# Patient Record
Sex: Female | Born: 1985 | Race: Black or African American | Hispanic: No | Marital: Single | Smoking: Former smoker
Health system: Southern US, Community
[De-identification: ages and names within clinical notes are randomized; demographics above are authoritative.]

## PROBLEM LIST (undated history)

## (undated) DIAGNOSIS — A609 Anogenital herpesviral infection, unspecified: Secondary | ICD-10-CM

## (undated) DIAGNOSIS — R87619 Unspecified abnormal cytological findings in specimens from cervix uteri: Secondary | ICD-10-CM

## (undated) DIAGNOSIS — A599 Trichomoniasis, unspecified: Secondary | ICD-10-CM

## (undated) DIAGNOSIS — IMO0002 Reserved for concepts with insufficient information to code with codable children: Secondary | ICD-10-CM

## (undated) DIAGNOSIS — O0930 Supervision of pregnancy with insufficient antenatal care, unspecified trimester: Secondary | ICD-10-CM

## (undated) HISTORY — DX: Trichomoniasis, unspecified: A59.9

## (undated) HISTORY — DX: Unspecified abnormal cytological findings in specimens from cervix uteri: R87.619

## (undated) HISTORY — DX: Reserved for concepts with insufficient information to code with codable children: IMO0002

## (undated) HISTORY — DX: Anogenital herpesviral infection, unspecified: A60.9

## (undated) HISTORY — PX: LEEP: SHX91

## (undated) HISTORY — DX: Supervision of pregnancy with insufficient antenatal care, unspecified trimester: O09.30

## (undated) HISTORY — PX: OTHER SURGICAL HISTORY: SHX169

---
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
## 2006-09-01 ENCOUNTER — Emergency Department (HOSPITAL_COMMUNITY): Admission: EM | Admit: 2006-09-01 | Discharge: 2006-09-01 | Payer: Self-pay | Admitting: Family Medicine

## 2008-04-04 ENCOUNTER — Emergency Department (HOSPITAL_COMMUNITY): Admission: EM | Admit: 2008-04-04 | Discharge: 2008-04-04 | Payer: Self-pay | Admitting: Family Medicine

## 2010-03-04 ENCOUNTER — Ambulatory Visit (HOSPITAL_COMMUNITY): Admission: RE | Admit: 2010-03-04 | Discharge: 2010-03-04 | Payer: Self-pay | Admitting: Obstetrics and Gynecology

## 2012-08-17 NOTE — L&D Delivery Note (Signed)
Delivery Note At 2:34 PM a viable female was delivered via  OA Presentation Apgars 9 9  Placenta status: spontaneously with 3 vessel cord, .  Cord:  with the following complications: none  Anesthesia: Epidural  Episiotomy: none Lacerations: first Suture Repair: 3.0 chromic Est. Blood Loss (mL): 300  Mom to postpartum.  Baby to Couplet care / Skin to Skin.  Stephanee Barcomb L 07/22/2013, 2:45 PM

## 2013-02-15 LAB — OB RESULTS CONSOLE RPR: RPR: NONREACTIVE

## 2013-02-15 LAB — OB RESULTS CONSOLE HIV ANTIBODY (ROUTINE TESTING): HIV: NONREACTIVE

## 2013-02-15 LAB — OB RESULTS CONSOLE HEPATITIS B SURFACE ANTIGEN: Hepatitis B Surface Ag: NEGATIVE

## 2013-02-15 LAB — OB RESULTS CONSOLE ANTIBODY SCREEN: Antibody Screen: NEGATIVE

## 2013-02-15 LAB — OB RESULTS CONSOLE GC/CHLAMYDIA
Chlamydia: NEGATIVE
Gonorrhea: NEGATIVE

## 2013-02-15 LAB — OB RESULTS CONSOLE ABO/RH: RH Type: POSITIVE

## 2013-02-15 LAB — OB RESULTS CONSOLE RUBELLA ANTIBODY, IGM: Rubella: IMMUNE

## 2013-07-03 LAB — OB RESULTS CONSOLE GBS: GBS: POSITIVE

## 2013-07-19 ENCOUNTER — Telehealth (HOSPITAL_COMMUNITY): Payer: Self-pay | Admitting: *Deleted

## 2013-07-19 ENCOUNTER — Encounter (HOSPITAL_COMMUNITY): Payer: Self-pay | Admitting: *Deleted

## 2013-07-19 NOTE — Telephone Encounter (Signed)
Preadmission screen  

## 2013-07-21 ENCOUNTER — Inpatient Hospital Stay (HOSPITAL_COMMUNITY)
Admission: AD | Admit: 2013-07-21 | Discharge: 2013-07-21 | Disposition: A | Discharge status: Home / Self Care | Payer: 59 | Source: Ambulatory Visit | Attending: Obstetrics and Gynecology | Admitting: Obstetrics and Gynecology

## 2013-07-21 ENCOUNTER — Inpatient Hospital Stay (HOSPITAL_COMMUNITY)
Admission: AD | Admit: 2013-07-21 | Discharge: 2013-07-24 | DRG: 775 | Disposition: A | Discharge status: Home / Self Care | Payer: 59 | Source: Ambulatory Visit | Attending: Obstetrics and Gynecology | Admitting: Obstetrics and Gynecology

## 2013-07-21 ENCOUNTER — Encounter (HOSPITAL_COMMUNITY): Payer: Self-pay | Admitting: *Deleted

## 2013-07-21 DIAGNOSIS — Z22330 Carrier of Group B streptococcus: Secondary | ICD-10-CM

## 2013-07-21 DIAGNOSIS — O479 False labor, unspecified: Secondary | ICD-10-CM | POA: Insufficient documentation

## 2013-07-21 DIAGNOSIS — Z87891 Personal history of nicotine dependence: Secondary | ICD-10-CM

## 2013-07-21 DIAGNOSIS — O99892 Other specified diseases and conditions complicating childbirth: Secondary | ICD-10-CM | POA: Diagnosis present

## 2013-07-21 DIAGNOSIS — IMO0001 Reserved for inherently not codable concepts without codable children: Secondary | ICD-10-CM

## 2013-07-21 DIAGNOSIS — R350 Frequency of micturition: Secondary | ICD-10-CM | POA: Insufficient documentation

## 2013-07-21 MED ORDER — LACTATED RINGERS IV SOLN
INTRAVENOUS | Status: DC
  Administered 2013-07-22 (×3): via INTRAVENOUS

## 2013-07-21 NOTE — MAU Note (Signed)
Having contractions. Was seen here earlier today and sent home. Think i lost my mucous plug

## 2013-07-21 NOTE — MAU Note (Signed)
Patient states she is having contractions every 5 minutes. Has had a little leaking but has frequent urination and not sure if it is urine. Denies bleeding. States not sure about fetal movement due to the pain of the contractions.

## 2013-07-22 ENCOUNTER — Encounter (HOSPITAL_COMMUNITY): Payer: 59 | Admitting: Anesthesiology

## 2013-07-22 ENCOUNTER — Encounter (HOSPITAL_COMMUNITY): Payer: Self-pay | Admitting: *Deleted

## 2013-07-22 ENCOUNTER — Inpatient Hospital Stay (HOSPITAL_COMMUNITY): Payer: 59 | Admitting: Anesthesiology

## 2013-07-22 DIAGNOSIS — IMO0001 Reserved for inherently not codable concepts without codable children: Secondary | ICD-10-CM

## 2013-07-22 LAB — CBC
HCT: 36.5 % (ref 36.0–46.0)
Hemoglobin: 13 g/dL (ref 12.0–15.0)
MCHC: 35.6 g/dL (ref 30.0–36.0)
Platelets: 256 10*3/uL (ref 150–400)
RDW: 14.7 % (ref 11.5–15.5)
WBC: 17.5 10*3/uL — ABNORMAL HIGH (ref 4.0–10.5)

## 2013-07-22 LAB — RPR: RPR Ser Ql: NONREACTIVE

## 2013-07-22 MED ORDER — LACTATED RINGERS IV SOLN: 500.0000 mL | INTRAVENOUS | Status: DC | PRN

## 2013-07-22 MED ORDER — ONDANSETRON HCL 4 MG PO TABS
4.0000 mg | ORAL_TABLET | ORAL | Status: DC | PRN
Start: 2013-07-22 — End: 2013-07-24

## 2013-07-22 MED ORDER — EPHEDRINE 5 MG/ML INJ
10.0000 mg | INTRAVENOUS | Status: DC | PRN
  Filled 2013-07-22: qty 2

## 2013-07-22 MED ORDER — DIPHENHYDRAMINE HCL 25 MG PO CAPS: 25.0000 mg | ORAL_CAPSULE | Freq: Four times a day (QID) | ORAL | Status: DC | PRN

## 2013-07-22 MED ORDER — OXYTOCIN 40 UNITS IN LACTATED RINGERS INFUSION - SIMPLE MED
1.0000 m[IU]/min | INTRAVENOUS | Status: DC
  Administered 2013-07-22: 2 m[IU]/min via INTRAVENOUS

## 2013-07-22 MED ORDER — PENICILLIN G POTASSIUM 5000000 UNITS IJ SOLR
5.0000 10*6.[IU] | Freq: Once | INTRAVENOUS | Status: AC
  Administered 2013-07-22: 5 10*6.[IU] via INTRAVENOUS
  Filled 2013-07-22: qty 5

## 2013-07-22 MED ORDER — LIDOCAINE HCL (PF) 1 % IJ SOLN
INTRAMUSCULAR | Status: DC | PRN
  Administered 2013-07-22 (×4): 4 mL

## 2013-07-22 MED ORDER — PRENATAL MULTIVITAMIN CH
1.0000 | ORAL_TABLET | Freq: Every day | ORAL | Status: DC
  Administered 2013-07-23 – 2013-07-24 (×2): 1 via ORAL
  Filled 2013-07-22 (×2): qty 1

## 2013-07-22 MED ORDER — LANOLIN HYDROUS EX OINT: TOPICAL_OINTMENT | CUTANEOUS | Status: DC | PRN

## 2013-07-22 MED ORDER — FLEET ENEMA 7-19 GM/118ML RE ENEM: 1.0000 | ENEMA | Freq: Every day | RECTAL | Status: DC | PRN

## 2013-07-22 MED ORDER — DIBUCAINE 1 % RE OINT: 1.0000 "application " | TOPICAL_OINTMENT | RECTAL | Status: DC | PRN

## 2013-07-22 MED ORDER — SIMETHICONE 80 MG PO CHEW: 80.0000 mg | CHEWABLE_TABLET | ORAL | Status: DC | PRN

## 2013-07-22 MED ORDER — ONDANSETRON HCL 4 MG/2ML IJ SOLN: 4.0000 mg | INTRAMUSCULAR | Status: DC | PRN

## 2013-07-22 MED ORDER — PHENYLEPHRINE 40 MCG/ML (10ML) SYRINGE FOR IV PUSH (FOR BLOOD PRESSURE SUPPORT)
80.0000 ug | PREFILLED_SYRINGE | INTRAVENOUS | Status: DC | PRN
  Filled 2013-07-22: qty 2
  Filled 2013-07-22: qty 10

## 2013-07-22 MED ORDER — LIDOCAINE HCL (PF) 1 % IJ SOLN
30.0000 mL | INTRAMUSCULAR | Status: DC | PRN
  Filled 2013-07-22 (×2): qty 30

## 2013-07-22 MED ORDER — PENICILLIN G POTASSIUM 5000000 UNITS IJ SOLR
2.5000 10*6.[IU] | INTRAVENOUS | Status: DC
  Administered 2013-07-22 (×3): 2.5 10*6.[IU] via INTRAVENOUS
  Filled 2013-07-22 (×7): qty 2.5

## 2013-07-22 MED ORDER — FLEET ENEMA 7-19 GM/118ML RE ENEM: 1.0000 | ENEMA | RECTAL | Status: DC | PRN

## 2013-07-22 MED ORDER — BENZOCAINE-MENTHOL 20-0.5 % EX AERO
1.0000 "application " | INHALATION_SPRAY | CUTANEOUS | Status: DC | PRN
  Administered 2013-07-22: 1 via TOPICAL
  Filled 2013-07-22: qty 56

## 2013-07-22 MED ORDER — PHENYLEPHRINE 40 MCG/ML (10ML) SYRINGE FOR IV PUSH (FOR BLOOD PRESSURE SUPPORT)
80.0000 ug | PREFILLED_SYRINGE | INTRAVENOUS | Status: DC | PRN
  Filled 2013-07-22: qty 2

## 2013-07-22 MED ORDER — ACETAMINOPHEN 500 MG PO TABS
1000.0000 mg | ORAL_TABLET | Freq: Four times a day (QID) | ORAL | Status: DC | PRN
  Administered 2013-07-22: 325 mg via ORAL

## 2013-07-22 MED ORDER — OXYCODONE-ACETAMINOPHEN 5-325 MG PO TABS: 1.0000 | ORAL_TABLET | ORAL | Status: DC | PRN

## 2013-07-22 MED ORDER — MEDROXYPROGESTERONE ACETATE 150 MG/ML IM SUSP: 150.0000 mg | INTRAMUSCULAR | Status: DC | PRN

## 2013-07-22 MED ORDER — WITCH HAZEL-GLYCERIN EX PADS: 1.0000 "application " | MEDICATED_PAD | CUTANEOUS | Status: DC | PRN

## 2013-07-22 MED ORDER — ZOLPIDEM TARTRATE 5 MG PO TABS: 5.0000 mg | ORAL_TABLET | Freq: Every evening | ORAL | Status: DC | PRN

## 2013-07-22 MED ORDER — BISACODYL 10 MG RE SUPP: 10.0000 mg | Freq: Every day | RECTAL | Status: DC | PRN

## 2013-07-22 MED ORDER — IBUPROFEN 600 MG PO TABS: 600.0000 mg | ORAL_TABLET | Freq: Four times a day (QID) | ORAL | Status: DC | PRN

## 2013-07-22 MED ORDER — SENNOSIDES-DOCUSATE SODIUM 8.6-50 MG PO TABS
2.0000 | ORAL_TABLET | ORAL | Status: DC
  Administered 2013-07-23 – 2013-07-24 (×2): 2 via ORAL
  Filled 2013-07-22 (×2): qty 2

## 2013-07-22 MED ORDER — BUTORPHANOL TARTRATE 1 MG/ML IJ SOLN: 2.0000 mg | Freq: Once | INTRAMUSCULAR | Status: DC

## 2013-07-22 MED ORDER — ACETAMINOPHEN 325 MG PO TABS
650.0000 mg | ORAL_TABLET | ORAL | Status: DC | PRN
  Administered 2013-07-22: 650 mg via ORAL
  Filled 2013-07-22 (×2): qty 2

## 2013-07-22 MED ORDER — TERBUTALINE SULFATE 1 MG/ML IJ SOLN: 0.2500 mg | Freq: Once | INTRAMUSCULAR | Status: DC | PRN

## 2013-07-22 MED ORDER — MEASLES, MUMPS & RUBELLA VAC ~~LOC~~ INJ
0.5000 mL | INJECTION | Freq: Once | SUBCUTANEOUS | Status: DC
  Filled 2013-07-22: qty 0.5

## 2013-07-22 MED ORDER — CITRIC ACID-SODIUM CITRATE 334-500 MG/5ML PO SOLN
30.0000 mL | ORAL | Status: DC | PRN
  Administered 2013-07-22: 30 mL via ORAL
  Filled 2013-07-22: qty 15

## 2013-07-22 MED ORDER — FENTANYL 2.5 MCG/ML BUPIVACAINE 1/10 % EPIDURAL INFUSION (WH - ANES)
14.0000 mL/h | INTRAMUSCULAR | Status: DC | PRN
  Administered 2013-07-22 (×3): 14 mL/h via EPIDURAL
  Filled 2013-07-22 (×3): qty 125

## 2013-07-22 MED ORDER — TETANUS-DIPHTH-ACELL PERTUSSIS 5-2.5-18.5 LF-MCG/0.5 IM SUSP: 0.5000 mL | Freq: Once | INTRAMUSCULAR | Status: DC

## 2013-07-22 MED ORDER — OXYTOCIN 40 UNITS IN LACTATED RINGERS INFUSION - SIMPLE MED
62.5000 mL/h | INTRAVENOUS | Status: DC
  Administered 2013-07-22: 62.5 mL/h via INTRAVENOUS
  Filled 2013-07-22: qty 1000

## 2013-07-22 MED ORDER — ONDANSETRON HCL 4 MG/2ML IJ SOLN
4.0000 mg | Freq: Four times a day (QID) | INTRAMUSCULAR | Status: DC | PRN
  Administered 2013-07-22: 4 mg via INTRAVENOUS
  Filled 2013-07-22: qty 2

## 2013-07-22 MED ORDER — LACTATED RINGERS IV SOLN
500.0000 mL | Freq: Once | INTRAVENOUS | Status: AC
  Administered 2013-07-22: 500 mL via INTRAVENOUS

## 2013-07-22 MED ORDER — IBUPROFEN 600 MG PO TABS
600.0000 mg | ORAL_TABLET | Freq: Four times a day (QID) | ORAL | Status: DC
  Administered 2013-07-22 – 2013-07-24 (×8): 600 mg via ORAL
  Filled 2013-07-22 (×8): qty 1

## 2013-07-22 MED ORDER — OXYCODONE-ACETAMINOPHEN 5-325 MG PO TABS
1.0000 | ORAL_TABLET | ORAL | Status: DC | PRN
  Administered 2013-07-23 (×2): 2 via ORAL
  Administered 2013-07-23 (×2): 1 via ORAL
  Administered 2013-07-24 (×3): 2 via ORAL
  Filled 2013-07-22 (×4): qty 2
  Filled 2013-07-22 (×2): qty 1
  Filled 2013-07-22: qty 2

## 2013-07-22 MED ORDER — OXYTOCIN BOLUS FROM INFUSION: 500.0000 mL | INTRAVENOUS | Status: DC

## 2013-07-22 MED ORDER — EPHEDRINE 5 MG/ML INJ
10.0000 mg | INTRAVENOUS | Status: DC | PRN
  Filled 2013-07-22: qty 2
  Filled 2013-07-22: qty 4

## 2013-07-22 MED ORDER — DIPHENHYDRAMINE HCL 50 MG/ML IJ SOLN: 12.5000 mg | INTRAMUSCULAR | Status: DC | PRN

## 2013-07-22 NOTE — Anesthesia Preprocedure Evaluation (Signed)
Anesthesia Evaluation  Patient identified by MRN, date of birth, ID band Patient awake    Reviewed: Allergy & Precautions, H&P , NPO status , Patient's Chart, lab work & pertinent test results, reviewed documented beta blocker date and time   History of Anesthesia Complications Negative for: history of anesthetic complications  Airway Mallampati: III TM Distance: >3 FB Neck ROM: full    Dental  (+) Teeth Intact   Pulmonary neg pulmonary ROS, former smoker,  breath sounds clear to auscultation        Cardiovascular negative cardio ROS  Rhythm:regular Rate:Normal     Neuro/Psych negative neurological ROS  negative psych ROS   GI/Hepatic Neg liver ROS, GERD-  ,  Endo/Other  negative endocrine ROS  Renal/GU negative Renal ROS     Musculoskeletal   Abdominal   Peds  Hematology negative hematology ROS (+)   Anesthesia Other Findings   Reproductive/Obstetrics (+) Pregnancy                           Anesthesia Physical Anesthesia Plan  ASA: II  Anesthesia Plan: Epidural   Post-op Pain Management:    Induction:   Airway Management Planned:   Additional Equipment:   Intra-op Plan:   Post-operative Plan:   Informed Consent: I have reviewed the patients History and Physical, chart, labs and discussed the procedure including the risks, benefits and alternatives for the proposed anesthesia with the patient or authorized representative who has indicated his/her understanding and acceptance.     Plan Discussed with:   Anesthesia Plan Comments:         Anesthesia Quick Evaluation

## 2013-07-22 NOTE — Progress Notes (Signed)
Dr Vincente Poli updated on temperature and sve. Order received.
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
## 2013-07-22 NOTE — Anesthesia Procedure Notes (Signed)
Epidural Patient location during procedure: OB Start time: 07/22/2013 1:08 AM  Staffing Performed by: anesthesiologist   Preanesthetic Checklist Completed: patient identified, site marked, surgical consent, pre-op evaluation, timeout performed, IV checked, risks and benefits discussed and monitors and equipment checked  Epidural Patient position: sitting Prep: site prepped and draped and DuraPrep Patient monitoring: continuous pulse ox and blood pressure Approach: midline Injection technique: LOR air  Needle:  Needle type: Tuohy  Needle gauge: 17 G Needle length: 9 cm and 9 Needle insertion depth: 7 cm Catheter type: closed end flexible Catheter size: 19 Gauge Catheter at skin depth: 11 cm Test dose: negative  Assessment Events: blood not aspirated, injection not painful, no injection resistance, negative IV test and no paresthesia  Additional Notes Discussed risk of headache, infection, bleeding, nerve injury and failed or incomplete block.  Patient voices understanding and wishes to proceed.  Epidural placed easily on first attempt.  No paresthesia.  Some heme with first aspirate, cath withdrawn once cm and negative aspirate.  All test doses negative.  Patient tolerated procedure well.  Jasmine December, MDReason for block:procedure for pain

## 2013-07-23 LAB — CBC
HCT: 29.2 % — ABNORMAL LOW (ref 36.0–46.0)
MCV: 84.4 fL (ref 78.0–100.0)
RBC: 3.46 MIL/uL — ABNORMAL LOW (ref 3.87–5.11)
WBC: 21.3 10*3/uL — ABNORMAL HIGH (ref 4.0–10.5)

## 2013-07-23 NOTE — Progress Notes (Signed)
Post Partum Day 1 Subjective: no complaints, up ad lib, voiding and tolerating PO  Objective: Blood pressure 135/86, pulse 70, temperature 97.9 F (36.6 C), temperature source Oral, resp. rate 18, height 5' 4.5" (1.638 m), weight 83.462 kg (184 lb), SpO2 100.00%, unknown if currently breastfeeding.  Physical Exam:  General: alert, cooperative and appears stated age Lochia: appropriate Uterine Fundus: firm Incision: healing well DVT Evaluation: No evidence of DVT seen on physical exam.   Recent Labs  07/22/13 0014 07/23/13 0549  HGB 13.0 10.1*  HCT 36.5 29.2*    Assessment/Plan: Plan for discharge tomorrow and Breastfeeding   LOS: 2 days   Jmya Uliano L 07/23/2013, 7:48 AM

## 2013-07-23 NOTE — Lactation Note (Signed)
This note was copied from the chart of Angela Mann. Lactation Consultation Note  Patient Name: Angela Jakara Blatter [id number] Date: 07/23/2013 Reason for consult: Initial assessment of this primipara and her newborn at 44 hours of age and requiring phototx.  RN, Gaylyn Rong had assisted with recent latch and baby is latched to mom's (L) breast with rhythmical sucking bursts.  Mom noting uterine contractions associated with feeding and LC discussed hormone releasing milk to baby at same time as it stimulates ctx but that this won't last more than a week.  LC encouraged cue feedings, STS and reviewed hand expression and reasons for expressing milk, including nipple care.  Baby has fed 12 times since birth for 10-30 minutes each feeding.  Mom planning to return to work, has borrowed Public [employer]arian pump but requests kit (RN to provide).  LC reviewed storage guidelines on p. 25 of Baby and Me and LC encouraged review of Baby and Me pp 14 and 20-25 for STS and BF information. LC provided [employer] brochure and reviewed [facility] services and list of community and web site resources.    Maternal Data Formula Feeding for Exclusion: No Infant to breast within first hour of birth: Yes (initial LATCH score=7; breastfed 10 minutes) Has patient been taught Hand Expression?: Yes (nurse had shown and LC reviewed hand expression) Does the patient have breastfeeding experience prior to this delivery?: No  Feeding Feeding Type: Breast Fed  LATCH Score/Interventions Latch: Repeated attempts needed to sustain latch, nipple held in mouth throughout feeding, stimulation needed to elicit sucking reflex. Intervention(s): Adjust position;Assist with latch  Audible Swallowing: A few with stimulation  Type of Nipple: Everted at rest and after stimulation  Comfort (Breast/Nipple): Soft / non-tender     Hold (Positioning): Assistance needed to correctly position infant at breast and maintain latch. Intervention(s): Support  Pillows;Position options;Breastfeeding basics reviewed  LATCH Score: 7 (current feeding in progress, with assessment at time of latch by RN, Gaylyn Rong)  Lactation Tools Discussed/Used WIC Program: Yes Pump Review: Setup, frequency, and cleaning;Milk Storage;Other (comment) (mom not pumping yet) Initiated by:: Warrick Parisian, RN, IBCLC Date initiated:: 07/23/13  STS, hand expression, cue feedings  Consult Status Consult Status: Follow-up Date: 07/24/13 Follow-up type: In-patient    Warrick Parisian [facility] 07/23/2013, 5:09 PM

## 2013-07-23 NOTE — Anesthesia Postprocedure Evaluation (Addendum)
  Anesthesia Post-op Note  Patient: Angela Mann  Procedure(s) Performed: * No procedures listed *  Patient Location: Mother Baby  Anesthesia Type:Epidural  Level of Consciousness: awake and alert   Airway and Oxygen Therapy: Patient Spontanous Breathing  Post-op Pain: mild  Post-op Assessment: Patient's Cardiovascular Status Stable, Respiratory Function Stable, No signs of Nausea or vomiting, Pain level controlled, No headache, No residual numbness and No residual motor weakness  Post-op Vital Signs: stable  Complications: No apparent anesthesia complications

## 2013-07-23 NOTE — Progress Notes (Signed)
Mother fed infant 15 ml in 45 minutes. Mother again given demonstration on how to awaken infant for feedings after stating "He starts off really good with taking the bottle then he goes to sleep".

## 2013-07-24 ENCOUNTER — Inpatient Hospital Stay (HOSPITAL_COMMUNITY): Admission: RE | Admit: 2013-07-24 | Payer: 59 | Source: Ambulatory Visit

## 2013-07-24 MED ORDER — OXYCODONE-ACETAMINOPHEN 5-325 MG PO TABS: 1.0000 | ORAL_TABLET | ORAL | Status: DC | PRN

## 2013-07-24 MED ORDER — IBUPROFEN 600 MG PO TABS: 600.0000 mg | ORAL_TABLET | Freq: Four times a day (QID) | ORAL | Status: DC

## 2013-07-24 NOTE — Lactation Note (Signed)
This note was copied from the chart of Angela Mann. Lactation Consultation Note: Follow up visit with mom. She has baby latched to breast in sidelying position- looks very relaxed with feeding. She reports that baby has been nursing well. Baby continues under phototherapy. Mom reports that breasts are feeling a little fuller this morning. Reviewed engorgement prevention and treatment. Has borrowed Medela pump for home use. Has DEBP set up in room. Reports that she has used it a couple of times- obtained about 15 cc's last feeding. Reminded to take pump pieces home. No questions at present. To call prn  Patient Name: Angela Mann [id number] Date: 07/24/2013 Reason for consult: Follow-up assessment   Maternal Data    Feeding   LATCH Score/Interventions Latch: Grasps breast easily, tongue down, lips flanged, rhythmical sucking.  Audible Swallowing: A few with stimulation  Type of Nipple: Everted at rest and after stimulation  Comfort (Breast/Nipple): Soft / non-tender     Hold (Positioning): No assistance needed to correctly position infant at breast. Intervention(s): Breastfeeding basics reviewed  LATCH Score: 9  Lactation Tools Discussed/Used     Consult Status Consult Status: Complete    Pamelia Hoit 07/24/2013, 8:31 AM

## 2013-07-24 NOTE — Discharge Summary (Signed)
Obstetric Discharge Summary Reason for Admission: onset of labor Prenatal Procedures: ultrasound Intrapartum Procedures: spontaneous vaginal delivery Postpartum Procedures: none Complications-Operative and Postpartum: 1 degree perineal laceration Hemoglobin  Date Value Range Status  07/23/2013 10.1* 12.0 - 15.0 g/dL Final     REPEATED TO VERIFY     DELTA CHECK NOTED     HCT  Date Value Range Status  07/23/2013 29.2* 36.0 - 46.0 % Final    Physical Exam:  General: alert and cooperative Lochia: appropriate Uterine Fundus: firm Incision: perineum intact DVT Evaluation: No evidence of DVT seen on physical exam. Negative Homan's sign. No cords or calf tenderness. No significant calf/ankle edema.  Discharge Diagnoses: Term Pregnancy-delivered  Discharge Information: Date: 07/24/2013 Activity: pelvic rest Diet: routine Medications: Tylenol #3, Ibuprofen and Percocet Condition: stable Instructions: refer to practice specific booklet Discharge to: home   Newborn Data: Live born female  Birth Weight: 6 lb 11.4 oz (3045 g) APGAR: 9, 9  Home with mother if baby is discharged today(baby with hyperbilirubinemia under phototherapy).  CURTIS,CAROL G 07/24/2013, 8:14 AM

## 2014-06-18 ENCOUNTER — Encounter (HOSPITAL_COMMUNITY): Payer: Self-pay | Admitting: *Deleted

## 2015-01-05 ENCOUNTER — Emergency Department (HOSPITAL_COMMUNITY)
Admission: EM | Admit: 2015-01-05 | Discharge: 2015-01-05 | Disposition: A | Discharge status: Home / Self Care | Payer: No Typology Code available for payment source | Attending: Emergency Medicine | Admitting: Emergency Medicine

## 2015-01-05 ENCOUNTER — Encounter (HOSPITAL_COMMUNITY): Payer: Self-pay | Admitting: Emergency Medicine

## 2015-01-05 DIAGNOSIS — S39012A Strain of muscle, fascia and tendon of lower back, initial encounter: Secondary | ICD-10-CM | POA: Diagnosis not present

## 2015-01-05 DIAGNOSIS — Y9389 Activity, other specified: Secondary | ICD-10-CM | POA: Diagnosis not present

## 2015-01-05 DIAGNOSIS — S199XXA Unspecified injury of neck, initial encounter: Secondary | ICD-10-CM | POA: Insufficient documentation

## 2015-01-05 DIAGNOSIS — S3992XA Unspecified injury of lower back, initial encounter: Secondary | ICD-10-CM | POA: Diagnosis present

## 2015-01-05 DIAGNOSIS — Z8619 Personal history of other infectious and parasitic diseases: Secondary | ICD-10-CM | POA: Insufficient documentation

## 2015-01-05 DIAGNOSIS — Y998 Other external cause status: Secondary | ICD-10-CM | POA: Insufficient documentation

## 2015-01-05 DIAGNOSIS — Y92410 Unspecified street and highway as the place of occurrence of the external cause: Secondary | ICD-10-CM | POA: Diagnosis not present

## 2015-01-05 DIAGNOSIS — Z87891 Personal history of nicotine dependence: Secondary | ICD-10-CM | POA: Insufficient documentation

## 2015-01-05 MED ORDER — TRAMADOL HCL 50 MG PO TABS: 50.0000 mg | ORAL_TABLET | Freq: Four times a day (QID) | ORAL | Status: DC | PRN

## 2015-01-05 MED ORDER — CYCLOBENZAPRINE HCL 5 MG PO TABS: 5.0000 mg | ORAL_TABLET | Freq: Three times a day (TID) | ORAL | Status: DC | PRN

## 2015-01-05 MED ORDER — NAPROXEN 375 MG PO TABS: 375.0000 mg | ORAL_TABLET | Freq: Two times a day (BID) | ORAL | Status: DC
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
## 2015-08-18 NOTE — L&D Delivery Note (Signed)
Delivery Note At 12:34 PM a viable female was delivered via Vaginal, Spontaneous Delivery APGAR: 9 9 weight pending .   Placenta status: delivered spontaneously , .  Cord:  with the following complicationsnone: .  Cord pH: not obtained  Anesthesia:  epidural Episiotomy: None Lacerations: None Suture Repair: not applicable Est. Blood Loss (mL):  300  Mom to postpartum.  Baby to Couplet care / Skin to Skin.  Noora Locascio L 07/20/2016, 12:43 PM

## 2015-12-05 LAB — OB RESULTS CONSOLE RUBELLA ANTIBODY, IGM: RUBELLA: IMMUNE

## 2015-12-05 LAB — OB RESULTS CONSOLE ABO/RH: RH Type: POSITIVE

## 2015-12-05 LAB — OB RESULTS CONSOLE HEPATITIS B SURFACE ANTIGEN: HEP B S AG: NEGATIVE

## 2015-12-05 LAB — OB RESULTS CONSOLE GC/CHLAMYDIA
Chlamydia: NEGATIVE
GC PROBE AMP, GENITAL: NEGATIVE

## 2015-12-05 LAB — OB RESULTS CONSOLE HIV ANTIBODY (ROUTINE TESTING): HIV: NONREACTIVE

## 2015-12-05 LAB — OB RESULTS CONSOLE ANTIBODY SCREEN: Antibody Screen: NEGATIVE

## 2015-12-05 LAB — OB RESULTS CONSOLE RPR: RPR: NONREACTIVE

## 2016-07-17 ENCOUNTER — Telehealth (HOSPITAL_COMMUNITY): Payer: Self-pay | Admitting: *Deleted

## 2016-07-17 ENCOUNTER — Encounter (HOSPITAL_COMMUNITY): Payer: Self-pay | Admitting: *Deleted

## 2016-07-17 NOTE — Telephone Encounter (Signed)
Preadmission screen  

## 2016-07-20 ENCOUNTER — Encounter (HOSPITAL_COMMUNITY): Payer: Self-pay

## 2016-07-20 ENCOUNTER — Inpatient Hospital Stay (HOSPITAL_COMMUNITY): Payer: Managed Care, Other (non HMO) | Admitting: Anesthesiology

## 2016-07-20 ENCOUNTER — Inpatient Hospital Stay (HOSPITAL_COMMUNITY)
Admission: AD | Admit: 2016-07-20 | Discharge: 2016-07-22 | DRG: 775 | Disposition: A | Discharge status: Home / Self Care | Payer: Managed Care, Other (non HMO) | Source: Ambulatory Visit | Attending: Obstetrics and Gynecology | Admitting: Obstetrics and Gynecology

## 2016-07-20 DIAGNOSIS — K219 Gastro-esophageal reflux disease without esophagitis: Secondary | ICD-10-CM | POA: Diagnosis present

## 2016-07-20 DIAGNOSIS — Z3A40 40 weeks gestation of pregnancy: Secondary | ICD-10-CM

## 2016-07-20 DIAGNOSIS — O9962 Diseases of the digestive system complicating childbirth: Secondary | ICD-10-CM | POA: Diagnosis present

## 2016-07-20 DIAGNOSIS — Z3493 Encounter for supervision of normal pregnancy, unspecified, third trimester: Secondary | ICD-10-CM | POA: Diagnosis present

## 2016-07-20 DIAGNOSIS — Z833 Family history of diabetes mellitus: Secondary | ICD-10-CM | POA: Diagnosis not present

## 2016-07-20 DIAGNOSIS — Z87891 Personal history of nicotine dependence: Secondary | ICD-10-CM

## 2016-07-20 DIAGNOSIS — Z8249 Family history of ischemic heart disease and other diseases of the circulatory system: Secondary | ICD-10-CM | POA: Diagnosis not present

## 2016-07-20 LAB — TYPE AND SCREEN
ABO/RH(D): O POS
Antibody Screen: NEGATIVE

## 2016-07-20 LAB — ABO/RH: ABO/RH(D): O POS

## 2016-07-20 LAB — CBC
HCT: 36.4 % (ref 36.0–46.0)
HEMOGLOBIN: 12.7 g/dL (ref 12.0–15.0)
MCH: 27.7 pg (ref 26.0–34.0)
MCHC: 34.9 g/dL (ref 30.0–36.0)
MCV: 79.3 fL (ref 78.0–100.0)
Platelets: 231 10*3/uL (ref 150–400)
RBC: 4.59 MIL/uL (ref 3.87–5.11)
RDW: 15.2 % (ref 11.5–15.5)
WBC: 12.2 10*3/uL — ABNORMAL HIGH (ref 4.0–10.5)

## 2016-07-20 LAB — OB RESULTS CONSOLE GBS: GBS: NEGATIVE

## 2016-07-20 MED ORDER — PHENYLEPHRINE 40 MCG/ML (10ML) SYRINGE FOR IV PUSH (FOR BLOOD PRESSURE SUPPORT)
PREFILLED_SYRINGE | INTRAVENOUS | Status: AC
  Filled 2016-07-20: qty 20

## 2016-07-20 MED ORDER — IBUPROFEN 600 MG PO TABS
600.0000 mg | ORAL_TABLET | Freq: Four times a day (QID) | ORAL | Status: DC
  Administered 2016-07-20 – 2016-07-22 (×7): 600 mg via ORAL
  Filled 2016-07-20 (×7): qty 1

## 2016-07-20 MED ORDER — LACTATED RINGERS IV SOLN
INTRAVENOUS | Status: DC
  Administered 2016-07-20: 08:00:00 via INTRAVENOUS

## 2016-07-20 MED ORDER — FLEET ENEMA 7-19 GM/118ML RE ENEM: 1.0000 | ENEMA | Freq: Every day | RECTAL | Status: DC | PRN

## 2016-07-20 MED ORDER — SOD CITRATE-CITRIC ACID 500-334 MG/5ML PO SOLN: 30.0000 mL | ORAL | Status: DC | PRN

## 2016-07-20 MED ORDER — PHENYLEPHRINE 40 MCG/ML (10ML) SYRINGE FOR IV PUSH (FOR BLOOD PRESSURE SUPPORT)
80.0000 ug | PREFILLED_SYRINGE | INTRAVENOUS | Status: DC | PRN
  Filled 2016-07-20: qty 10
  Filled 2016-07-20: qty 5

## 2016-07-20 MED ORDER — SIMETHICONE 80 MG PO CHEW: 80.0000 mg | CHEWABLE_TABLET | ORAL | Status: DC | PRN

## 2016-07-20 MED ORDER — LIDOCAINE HCL (PF) 1 % IJ SOLN
30.0000 mL | INTRAMUSCULAR | Status: DC | PRN
  Filled 2016-07-20: qty 30

## 2016-07-20 MED ORDER — BENZOCAINE-MENTHOL 20-0.5 % EX AERO: 1.0000 "application " | INHALATION_SPRAY | CUTANEOUS | Status: DC | PRN

## 2016-07-20 MED ORDER — EPHEDRINE 5 MG/ML INJ
10.0000 mg | INTRAVENOUS | Status: DC | PRN
  Filled 2016-07-20: qty 4

## 2016-07-20 MED ORDER — FENTANYL 2.5 MCG/ML BUPIVACAINE 1/10 % EPIDURAL INFUSION (WH - ANES)
14.0000 mL/h | INTRAMUSCULAR | Status: DC | PRN
  Administered 2016-07-20 (×2): 14 mL/h via EPIDURAL
  Filled 2016-07-20: qty 100

## 2016-07-20 MED ORDER — DIBUCAINE 1 % RE OINT: 1.0000 "application " | TOPICAL_OINTMENT | RECTAL | Status: DC | PRN

## 2016-07-20 MED ORDER — ACETAMINOPHEN 325 MG PO TABS: 650.0000 mg | ORAL_TABLET | ORAL | Status: DC | PRN

## 2016-07-20 MED ORDER — BISACODYL 10 MG RE SUPP: 10.0000 mg | Freq: Every day | RECTAL | Status: DC | PRN

## 2016-07-20 MED ORDER — COCONUT OIL OIL: 1.0000 "application " | TOPICAL_OIL | Status: DC | PRN

## 2016-07-20 MED ORDER — DIPHENHYDRAMINE HCL 25 MG PO CAPS
25.0000 mg | ORAL_CAPSULE | Freq: Four times a day (QID) | ORAL | Status: DC | PRN
  Administered 2016-07-22: 25 mg via ORAL
  Filled 2016-07-20: qty 1

## 2016-07-20 MED ORDER — PHENYLEPHRINE 40 MCG/ML (10ML) SYRINGE FOR IV PUSH (FOR BLOOD PRESSURE SUPPORT)
80.0000 ug | PREFILLED_SYRINGE | INTRAVENOUS | Status: DC | PRN
  Filled 2016-07-20: qty 5

## 2016-07-20 MED ORDER — ZOLPIDEM TARTRATE 5 MG PO TABS: 5.0000 mg | ORAL_TABLET | Freq: Every evening | ORAL | Status: DC | PRN

## 2016-07-20 MED ORDER — OXYCODONE-ACETAMINOPHEN 5-325 MG PO TABS: 1.0000 | ORAL_TABLET | ORAL | Status: DC | PRN

## 2016-07-20 MED ORDER — OXYCODONE-ACETAMINOPHEN 5-325 MG PO TABS: 2.0000 | ORAL_TABLET | ORAL | Status: DC | PRN

## 2016-07-20 MED ORDER — LACTATED RINGERS IV SOLN: 500.0000 mL | Freq: Once | INTRAVENOUS | Status: DC

## 2016-07-20 MED ORDER — ONDANSETRON HCL 4 MG/2ML IJ SOLN: 4.0000 mg | Freq: Four times a day (QID) | INTRAMUSCULAR | Status: DC | PRN

## 2016-07-20 MED ORDER — ONDANSETRON HCL 4 MG/2ML IJ SOLN: 4.0000 mg | INTRAMUSCULAR | Status: DC | PRN

## 2016-07-20 MED ORDER — MEDROXYPROGESTERONE ACETATE 150 MG/ML IM SUSP: 150.0000 mg | INTRAMUSCULAR | Status: DC | PRN

## 2016-07-20 MED ORDER — WITCH HAZEL-GLYCERIN EX PADS: 1.0000 "application " | MEDICATED_PAD | CUTANEOUS | Status: DC | PRN

## 2016-07-20 MED ORDER — OXYTOCIN BOLUS FROM INFUSION
500.0000 mL | Freq: Once | INTRAVENOUS | Status: AC
  Administered 2016-07-20: 500 mL via INTRAVENOUS

## 2016-07-20 MED ORDER — MEASLES, MUMPS & RUBELLA VAC ~~LOC~~ INJ
0.5000 mL | INJECTION | Freq: Once | SUBCUTANEOUS | Status: DC
  Filled 2016-07-20: qty 0.5

## 2016-07-20 MED ORDER — DIPHENHYDRAMINE HCL 50 MG/ML IJ SOLN: 12.5000 mg | INTRAMUSCULAR | Status: DC | PRN

## 2016-07-20 MED ORDER — LACTATED RINGERS IV SOLN: 500.0000 mL | INTRAVENOUS | Status: DC | PRN

## 2016-07-20 MED ORDER — TETANUS-DIPHTH-ACELL PERTUSSIS 5-2.5-18.5 LF-MCG/0.5 IM SUSP: 0.5000 mL | Freq: Once | INTRAMUSCULAR | Status: DC

## 2016-07-20 MED ORDER — OXYCODONE-ACETAMINOPHEN 5-325 MG PO TABS
1.0000 | ORAL_TABLET | ORAL | Status: DC | PRN
  Administered 2016-07-20 – 2016-07-22 (×7): 1 via ORAL
  Filled 2016-07-20 (×7): qty 1

## 2016-07-20 MED ORDER — LIDOCAINE HCL (PF) 1 % IJ SOLN
INTRAMUSCULAR | Status: DC | PRN
  Administered 2016-07-20 (×2): 5 mL via EPIDURAL

## 2016-07-20 MED ORDER — FENTANYL 2.5 MCG/ML BUPIVACAINE 1/10 % EPIDURAL INFUSION (WH - ANES)
INTRAMUSCULAR | Status: AC
  Filled 2016-07-20: qty 100

## 2016-07-20 MED ORDER — ONDANSETRON HCL 4 MG PO TABS: 4.0000 mg | ORAL_TABLET | ORAL | Status: DC | PRN

## 2016-07-20 MED ORDER — ACETAMINOPHEN 325 MG PO TABS
650.0000 mg | ORAL_TABLET | ORAL | Status: DC | PRN
  Administered 2016-07-20 – 2016-07-21 (×2): 650 mg via ORAL
  Filled 2016-07-20 (×2): qty 2

## 2016-07-20 MED ORDER — PRENATAL MULTIVITAMIN CH
1.0000 | ORAL_TABLET | Freq: Every day | ORAL | Status: DC
  Administered 2016-07-20 – 2016-07-21 (×2): 1 via ORAL
  Filled 2016-07-20 (×2): qty 1

## 2016-07-20 MED ORDER — OXYTOCIN 40 UNITS IN LACTATED RINGERS INFUSION - SIMPLE MED
2.5000 [IU]/h | INTRAVENOUS | Status: DC
  Filled 2016-07-20: qty 1000

## 2016-07-20 MED ORDER — FLEET ENEMA 7-19 GM/118ML RE ENEM: 1.0000 | ENEMA | RECTAL | Status: DC | PRN

## 2016-07-20 MED ORDER — SENNOSIDES-DOCUSATE SODIUM 8.6-50 MG PO TABS
2.0000 | ORAL_TABLET | ORAL | Status: DC
  Administered 2016-07-20 – 2016-07-21 (×2): 2 via ORAL
  Filled 2016-07-20 (×2): qty 2

## 2016-07-20 NOTE — Anesthesia Postprocedure Evaluation (Signed)
Anesthesia Post Note  Patient: Angela Mann  Procedure(s) Performed: * No procedures listed *  Patient location during evaluation: Mother Baby Anesthesia Type: Epidural Level of consciousness: awake Pain management: pain level controlled Vital Signs Assessment: post-procedure vital signs reviewed and stable Respiratory status: spontaneous breathing Cardiovascular status: stable Postop Assessment: no headache, no backache, epidural receding, patient able to bend at knees, no signs of nausea or vomiting and adequate PO intake Anesthetic complications: no     Last Vitals:  Vitals:   07/20/16 1346 07/20/16 1401  BP: 96/71 (!) 146/76  Pulse: 98 (!) 103  Resp:  18  Temp:      Last Pain:  Vitals:   07/20/16 1131  TempSrc: Oral  PainSc:    Pain Goal:                 Threasa Kinch

## 2016-07-20 NOTE — Anesthesia Preprocedure Evaluation (Signed)
Anesthesia Evaluation  Patient identified by MRN, date of birth, ID band Patient awake    Reviewed: Allergy & Precautions, H&P , NPO status , Patient's Chart, lab work & pertinent test results, reviewed documented beta blocker date and time   History of Anesthesia Complications Negative for: history of anesthetic complications  Airway Mallampati: III  TM Distance: >3 FB Neck ROM: full    Dental  (+) Teeth Intact, Dental Advisory Given   Pulmonary neg pulmonary ROS, former smoker,    breath sounds clear to auscultation       Cardiovascular negative cardio ROS   Rhythm:regular Rate:Normal     Neuro/Psych negative neurological ROS  negative psych ROS   GI/Hepatic Neg liver ROS, GERD  ,  Endo/Other  negative endocrine ROS  Renal/GU negative Renal ROS     Musculoskeletal   Abdominal   Peds  Hematology negative hematology ROS (+)   Anesthesia Other Findings   Reproductive/Obstetrics (+) Pregnancy                             Anesthesia Physical  Anesthesia Plan  ASA: II  Anesthesia Plan: Epidural   Post-op Pain Management:    Induction:   Airway Management Planned:   Additional Equipment:   Intra-op Plan:   Post-operative Plan:   Informed Consent: I have reviewed the patients History and Physical, chart, labs and discussed the procedure including the risks, benefits and alternatives for the proposed anesthesia with the patient or authorized representative who has indicated his/her understanding and acceptance.   Dental advisory given  Plan Discussed with: Anesthesiologist  Anesthesia Plan Comments:         Anesthesia Quick Evaluation

## 2016-07-20 NOTE — Anesthesia Pain Management Evaluation Note (Addendum)
  CRNA Pain Management Visit Note  Patient: Angela Mann, 30 y.o., female  "Hello I am a member of the anesthesia team at [facility]. We have an anesthesia team available at all times to provide care throughout the hospital, including epidural management and anesthesia for C-section. I don't know your plan for the delivery whether it a natural birth, water birth, IV sedation, nitrous supplementation, doula or epidural, but we want to meet your pain goals."   1.Was your pain managed to your expectations on prior hospitalizations?   Yes   2.What is your expectation for pain management during this hospitalization?     Epidural  3.How can we help you reach that goal? Place epidural at needed time.   Record the patient's initial score and the patient's pain goal.   Pain: 2  Pain Goal: 6 [facility] wants you to be able to say your pain was always managed very well.  Prisilla Kocsis 07/20/2016

## 2016-07-20 NOTE — Plan of Care (Signed)
Problem: Education: Goal: Knowledge of [facility] Education information/materials will improve Admission paperwork, baby and me booklet, and policies and procedures reviewed with patient and family. Patient verbalizes understanding.

## 2016-07-20 NOTE — MAU Note (Signed)
Pt presents complaining of contractions since 3am. Denies bleeding or leaking. Reports good fetal movement. 3cm in office.

## 2016-07-20 NOTE — Lactation Note (Signed)
This note was copied from a baby's chart. Lactation Consultation Note Initial visit at 3 hours of age. Mom has 8 weeks experience with older child breast feeding.  Mon denies problems with her older child breastfeeding until she returned to work and didn't continue breastfeeding.  Mom reports goal of 1 year breastfeeding this baby and has support at work this time.  Mom reports a good feeding after delivery and another with feeding cues.  Mom denies pain, but strong latch.  Mom reports deep latch.  [facility] resources given and discussed.  Encouraged to feed with early cues on demand.  Early newborn behavior discussed.  Hand expression demonstrated by mom with colostrum visible.  Mom to call for assist as needed.      Patient Name: Angela Mann ZOXWR'UToday's Date: 07/20/2016 Reason for consult: Initial assessment   Maternal Data Has patient been taught Hand Expression?: Yes Does the patient have breastfeeding experience prior to this delivery?: Yes  Feeding Feeding Type: Unknown Length of feed: 15 min  LATCH Score/Interventions Latch: Grasps breast easily, tongue down, lips flanged, rhythmical sucking.  Audible Swallowing: Spontaneous and intermittent  Type of Nipple: Everted at rest and after stimulation  Comfort (Breast/Nipple): Soft / non-tender     Hold (Positioning): No assistance needed to correctly position infant at breast.  LATCH Score: 10  Lactation Tools Discussed/Used     Consult Status Consult Status: Follow-up Date: 07/21/16 Follow-up type: In-patient    [name], [name] 07/20/2016, 4:28 PM
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
## 2016-07-20 NOTE — Anesthesia Procedure Notes (Signed)
Epidural Patient location during procedure: OB Start time: 07/20/2016 6:35 AM End time: 07/20/2016 6:51 AM  Staffing Anesthesiologist: Heather RobertsSINGER, Melonie Germani Performed: anesthesiologist   Preanesthetic Checklist Completed: patient identified, site marked, pre-op evaluation, timeout performed, IV checked, risks and benefits discussed and monitors and equipment checked  Epidural Patient position: sitting Prep: DuraPrep Patient monitoring: heart rate, cardiac monitor, continuous pulse ox and blood pressure Approach: midline Location: L2-L3 Injection technique: LOR saline  Needle:  Needle type: Tuohy  Needle gauge: 17 G Needle length: 9 cm Needle insertion depth: 7 cm Catheter size: 20 Guage Catheter at skin depth: 12 cm Test dose: negative and Other  Assessment Events: blood not aspirated, injection not painful, no injection resistance and negative IV test  Additional Notes Informed consent obtained prior to proceeding including risk of failure, 1% risk of PDPH, risk of minor discomfort and bruising.  Discussed rare but serious complications including epidural abscess, permanent nerve injury, epidural hematoma.  Discussed alternatives to epidural analgesia and patient desires to proceed.  Timeout performed pre-procedure verifying patient name, procedure, and platelet count.  Patient tolerated procedure well.

## 2016-07-21 LAB — CBC
HEMATOCRIT: 30.6 % — AB (ref 36.0–46.0)
HEMOGLOBIN: 10.6 g/dL — AB (ref 12.0–15.0)
MCH: 28 pg (ref 26.0–34.0)
MCHC: 34.6 g/dL (ref 30.0–36.0)
MCV: 80.7 fL (ref 78.0–100.0)
Platelets: 176 10*3/uL (ref 150–400)
RBC: 3.79 MIL/uL — ABNORMAL LOW (ref 3.87–5.11)
RDW: 15.5 % (ref 11.5–15.5)
WBC: 15.9 10*3/uL — ABNORMAL HIGH (ref 4.0–10.5)

## 2016-07-21 LAB — RPR: RPR: NONREACTIVE

## 2016-07-21 NOTE — Progress Notes (Signed)
Post Partum Day 1 Subjective: no complaints, up ad lib, voiding, tolerating PO and + flatus  Objective: Blood pressure 126/81, pulse 66, temperature 98.5 F (36.9 C), temperature source Oral, resp. rate 18, last menstrual period 10/14/2015, SpO2 97 %, unknown if currently breastfeeding.  Physical Exam:  General: alert and cooperative Lochia: appropriate Uterine Fundus: firm Incision: perineum intact DVT Evaluation: No evidence of DVT seen on physical exam. Negative Homan's sign. No cords or calf tenderness. No significant calf/ankle edema.   Recent Labs  07/20/16 0557 07/21/16 0550  HGB 12.7 10.6*  HCT 36.4 30.6*    Assessment/Plan: Plan for discharge tomorrow and Circumcision prior to discharge   LOS: 1 day   Angela Mann G 07/21/2016, 7:45 AM

## 2016-07-21 NOTE — Plan of Care (Signed)
Problem: Education: Goal: Knowledge of condition will improve Reviewed circ care with mother and father. Also encouraged mother to call for breast feeding for assistance or latch scores.

## 2016-07-21 NOTE — Plan of Care (Signed)
Problem: Activity: Goal: Risk for activity intolerance will decrease Outcome: Completed/Met Date Met: 07/21/16 Pt independent in room.  Problem: Fluid Volume: Goal: Ability to maintain a balanced intake and output will improve Outcome: Completed/Met Date Met: 07/21/16 Pt able to tolerate liquid and solid food.   Problem: Nutrition: Goal: Adequate nutrition will be maintained Outcome: Completed/Met Date Met: 07/21/16 Pt able to tolerate regular diet.

## 2016-07-21 NOTE — Lactation Note (Signed)
This note was copied from a baby's chart. Lactation Consultation Note  Patient Name: Angela [name] [id number]'[id number]'s Date: 07/21/2016 Reason for consult: Follow-up assessment Baby was circumcised this am and sleepy. Attempted to wake baby to BF but he would not latch. Placed baby STS on Mom and advised to call for assist with next feeding. Mom reports some mild nipple tenderness. Advised to apply EBM and Mom has comfort gels. Mom has colostrum present with hand expression. Mom has wide spacing between breasts and tubular shape to breast but reports making good milk with 1st baby. Advised baby should be at breast 8-12 times in 24 hours and with feeding ques. Reviewed positioning to obtain more depth with latch. Mom to call .   Maternal Data    Feeding Feeding Type: Breast Fed Length of feed: 0 min  LATCH Score/Interventions Latch: Too sleepy or reluctant, no latch achieved, no sucking elicited.                    Lactation Tools Discussed/Used     Consult Status Consult Status: Follow-up Date: 07/21/16 Follow-up type: In-patient    [name], [name] 07/21/2016, 12:13 PM

## 2016-07-21 NOTE — Plan of Care (Deleted)
Problem: Life Cycle: Goal: Risk for postpartum hemorrhage will decrease Outcome: Progressing Pt has scant to small amount of lochia - no clots present. Pt fundus is firm @ U   Problem: Bowel/Gastric: Goal: Gastrointestinal status will improve Outcome: Progressing Pt states she believes she is passing flatus. Is able to tolerate oral solids and liquids. Scheduled senna and simethicone given per MAR.  Problem: Urinary Elimination: Goal: Ability to reestablish a normal urinary elimination pattern will improve Outcome: Progressing Foley catheter in at this time draining yellow/amber color urine.

## 2016-07-22 ENCOUNTER — Inpatient Hospital Stay (HOSPITAL_COMMUNITY): Admission: RE | Admit: 2016-07-22 | Payer: No Typology Code available for payment source | Source: Ambulatory Visit

## 2016-07-22 MED ORDER — BENZONATATE 100 MG PO CAPS
200.0000 mg | ORAL_CAPSULE | Freq: Three times a day (TID) | ORAL | Status: DC
  Administered 2016-07-22: 200 mg via ORAL
  Filled 2016-07-22: qty 2

## 2016-07-22 MED ORDER — IBUPROFEN 600 MG PO TABS: 600.0000 mg | ORAL_TABLET | Freq: Four times a day (QID) | ORAL | 1 refills | Status: DC

## 2016-07-22 MED ORDER — AMOXICILLIN-POT CLAVULANATE 875-125 MG PO TABS
1.0000 | ORAL_TABLET | Freq: Two times a day (BID) | ORAL | Status: DC
  Administered 2016-07-22: 1 via ORAL
  Filled 2016-07-22: qty 1

## 2016-07-22 MED ORDER — INFLUENZA VAC SPLIT QUAD 0.5 ML IM SUSY: 0.5000 mL | PREFILLED_SYRINGE | INTRAMUSCULAR | 0 refills | Status: AC

## 2016-07-22 MED ORDER — OXYCODONE-ACETAMINOPHEN 5-325 MG PO TABS: 1.0000 | ORAL_TABLET | ORAL | 0 refills | Status: DC | PRN

## 2016-07-22 MED ORDER — AMOXICILLIN-POT CLAVULANATE 875-125 MG PO TABS: 1.0000 | ORAL_TABLET | Freq: Two times a day (BID) | ORAL | 0 refills | Status: DC

## 2016-07-22 MED ORDER — BENZONATATE 200 MG PO CAPS: 200.0000 mg | ORAL_CAPSULE | Freq: Three times a day (TID) | ORAL | 0 refills | Status: DC

## 2016-07-22 MED ORDER — INFLUENZA VAC SPLIT QUAD 0.5 ML IM SUSY: 0.5000 mL | PREFILLED_SYRINGE | Freq: Once | INTRAMUSCULAR | Status: DC

## 2016-07-22 NOTE — Discharge Summary (Signed)
Obstetric Discharge Summary Reason for Admission: onset of labor Prenatal Procedures: ultrasound Intrapartum Procedures: spontaneous vaginal delivery Postpartum Procedures: none Complications-Operative and Postpartum: none Hemoglobin  Date Value Ref Range Status  07/21/2016 10.6 (L) 12.0 - 15.0 g/dL Final   HCT  Date Value Ref Range Status  07/21/2016 30.6 (L) 36.0 - 46.0 % Final    Physical Exam:  General: alert and cooperative Lochia: appropriate Uterine Fundus: firm Incision: perineum intact DVT Evaluation: No evidence of DVT seen on physical exam. Negative Homan's sign. No cords or calf tenderness. No significant calf/ankle edema.  Discharge Diagnoses: Term Pregnancy-delivered  Discharge Information: Date: 07/22/2016 Activity: pelvic rest Diet: routine Medications: PNV, Ibuprofen, Percocet and Augmentin and Tessalon Condition: stable Instructions: refer to practice specific booklet Discharge to: home   Newborn Data: Live born female  Birth Weight: 8 lb 2.5 oz (3700 g) APGAR: 9, 9  Home with mother.  CURTIS,CAROL G 07/22/2016, 8:21 AM

## 2016-07-22 NOTE — Lactation Note (Signed)
This note was copied from a baby's chart. Lactation Consultation Note: Mother state that infant is feeding well. [facility] gave her a lot of tips that were very helpful. Mother is aware of cue base feeding infant and at least 8-12 times in 24hours. Mother advised to listen for swallows. Mother informed to do good breast massage and ice breast when milk is coming in. Mother informed of available LC services and community support.  Patient Name: Angela Mann     Maternal Data    Feeding Feeding Type: Breast Fed Length of feed:  (clustering)  LATCH Score/Interventions                      Lactation Tools Discussed/Used     Consult Status      Angela Mann, Angela Mann Mann, 9:39 AM

## 2016-07-22 NOTE — Plan of Care (Signed)
Problem: Skin Integrity: Goal: Risk for impaired skin integrity will decrease Outcome: Completed/Met Date Met: 07/22/16 Pt is able to move self in bed, is continent of bladder and bowel, no perineal or vaginal lacerations that needed repair. Able to change peri pads PRN.   Problem: Bowel/Gastric: Goal: Will not experience complications related to bowel motility Outcome: Progressing Pt is able to pass flatus - has not had bowel movement since delivery at this time.   Problem: Coping: Goal: Ability to cope will improve Outcome: Progressing Although infant spends large amounts of time feeding, patient has calm demeanor and understands plan of care and baby safety information regarding safe sleep.

## 2017-05-19 ENCOUNTER — Ambulatory Visit (INDEPENDENT_AMBULATORY_CARE_PROVIDER_SITE_OTHER): Payer: BLUE CROSS/BLUE SHIELD | Admitting: Family Medicine

## 2017-05-19 ENCOUNTER — Encounter: Payer: Self-pay | Admitting: Family Medicine

## 2017-05-19 VITALS — BP 110/70 | HR 69 | Ht 65.0 in | Wt 182.2 lb

## 2017-05-19 DIAGNOSIS — Z0000 Encounter for general adult medical examination without abnormal findings: Secondary | ICD-10-CM

## 2017-05-19 DIAGNOSIS — H547 Unspecified visual loss: Secondary | ICD-10-CM | POA: Diagnosis not present

## 2017-05-19 NOTE — Patient Instructions (Addendum)
Call and schedule an eye exam. Your visual acuity test was decreased.  Here is a list of Eye Doctors that you call and schedule an appointment with.   [facility] Address: [address] [place], [place] [address] Phone: [phone]   [facility] Address: [address], [place], [place] [address] Phone: [phone]  Dr. Dione Booze Address: [address] [place] [place], [place] [address] Phone: [phone]  [facility] [address] [place], [place]  [address] Telephone: [phone]   Preventative Care for Adults - Female      MAINTAIN REGULAR HEALTH EXAMS:  A routine yearly physical is a good way to check in with your primary care provider about your health and preventive screening. It is also an opportunity to share updates about your health and any concerns you have, and receive a thorough all-over exam.   Most health insurance companies pay for at least some preventative services.  Check with your health plan for specific coverages.  WHAT PREVENTATIVE SERVICES DO WOMEN NEED?  Adult women should have their weight and blood pressure checked regularly.   Women age 24 and older should have their cholesterol levels checked regularly.  Women should be screened for cervical cancer with a Pap smear and pelvic exam beginning at age 34.   Breast cancer screening generally begins at age 57 with a mammogram and breast exam by your primary care provider.    Beginning at age 83 and continuing to age 110, women should be screened for colorectal cancer.  Certain people may need continued testing until age 61.  Updating vaccinations is part of preventative care.  Vaccinations help protect against diseases such as the flu.  Osteoporosis is a disease in which the bones lose minerals and strength as we age. Women ages 86 and over should discuss this with their caregivers, as should women after menopause who have other risk factors.  Lab tests are generally  done as part of preventative care to screen for anemia and blood disorders, to screen for problems with the kidneys and liver, to screen for bladder problems, to check blood sugar, and to check your cholesterol level.  Preventative services generally include counseling about diet, exercise, avoiding tobacco, drugs, excessive alcohol consumption, and sexually transmitted infections.    GENERAL RECOMMENDATIONS FOR GOOD HEALTH:  Healthy diet:  Eat a variety of foods, including fruit, vegetables, animal or vegetable protein, such as meat, fish, chicken, and eggs, or beans, lentils, tofu, and grains, such as rice.  Drink plenty of water daily.  Decrease saturated fat in the diet, avoid lots of red meat, processed foods, sweets, fast foods, and fried foods.  Exercise:  Aerobic exercise helps maintain good heart health. At least 30-40 minutes of moderate-intensity exercise is recommended. For example, a brisk walk that increases your heart rate and breathing. This should be done on most days of the week.   Find a type of exercise or a variety of exercises that you enjoy so that it becomes a part of your daily life.  Examples are running, walking, swimming, water aerobics, and biking.  For motivation and support, explore group exercise such as aerobic class, spin class, Zumba, Yoga,or  martial arts, etc.    Set exercise goals for yourself, such as a certain weight goal, walk or run in a race such as a 5k walk/run.  Speak to your primary care provider about exercise goals.  Disease prevention:  If you smoke or  chew tobacco, find out from your caregiver how to quit. It can literally save your life, no matter how long you have been a tobacco user. If you do not use tobacco, never begin.   Maintain a healthy diet and normal weight. Increased weight leads to problems with blood pressure and diabetes.   The Body Mass Index or BMI is a way of measuring how much of your body is fat. Having a BMI above 27  increases the risk of heart disease, diabetes, hypertension, stroke and other problems related to obesity. Your caregiver can help determine your BMI and based on it develop an exercise and dietary program to help you achieve or maintain this important measurement at a healthful level.  High blood pressure causes heart and blood vessel problems.  Persistent high blood pressure should be treated with medicine if weight loss and exercise do not work.   Fat and cholesterol leaves deposits in your arteries that can block them. This causes heart disease and vessel disease elsewhere in your body.  If your cholesterol is found to be high, or if you have heart disease or certain other medical conditions, then you may need to have your cholesterol monitored frequently and be treated with medication.   Ask if you should have a cardiac stress test if your history suggests this. A stress test is a test done on a treadmill that looks for heart disease. This test can find disease prior to there being a problem.  Menopause can be associated with physical symptoms and risks. Hormone replacement therapy is available to decrease these. You should talk to your caregiver about whether starting or continuing to take hormones is right for you.   Osteoporosis is a disease in which the bones lose minerals and strength as we age. This can result in serious bone fractures. Risk of osteoporosis can be identified using a bone density scan. Women ages 16 and over should discuss this with their caregivers, as should women after menopause who have other risk factors. Ask your caregiver whether you should be taking a calcium supplement and Vitamin D, to reduce the rate of osteoporosis.   Avoid drinking alcohol in excess (more than two drinks per day).  Avoid use of street drugs. Do not share needles with anyone. Ask for professional help if you need assistance or instructions on stopping the use of alcohol, cigarettes, and/or  drugs.  Brush your teeth twice a day with fluoride toothpaste, and floss once a day. Good oral hygiene prevents tooth decay and gum disease. The problems can be painful, unattractive, and can cause other health problems. Visit your dentist for a routine oral and dental check up and preventive care every 6-12 months.   Look at your skin regularly.  Use a mirror to look at your back. Notify your caregivers of changes in moles, especially if there are changes in shapes, colors, a size larger than a pencil eraser, an irregular border, or development of new moles.  Safety:  Use seatbelts 100% of the time, whether driving or as a passenger.  Use safety devices such as hearing protection if you work in environments with loud noise or significant background noise.  Use safety glasses when doing any work that could send debris in to the eyes.  Use a helmet if you ride a bike or motorcycle.  Use appropriate safety gear for contact sports.  Talk to your caregiver about gun safety.  Use sunscreen with a SPF (or skin protection factor) of 15  or greater.  Lighter skinned people are at a greater risk of skin cancer. Don't forget to also wear sunglasses in order to protect your eyes from too much damaging sunlight. Damaging sunlight can accelerate cataract formation.   Practice safe sex. Use condoms. Condoms are used for birth control and to help reduce the spread of sexually transmitted infections (or STIs).  Some of the STIs are gonorrhea (the clap), chlamydia, syphilis, trichomonas, herpes, HPV (human papilloma virus) and HIV (human immunodeficiency virus) which causes AIDS. The herpes, HIV and HPV are viral illnesses that have no cure. These can result in disability, cancer and death.   Keep carbon monoxide and smoke detectors in your home functioning at all times. Change the batteries every 6 months or use a model that plugs into the wall.   Vaccinations:  Stay up to date with your tetanus shots and other  required immunizations. You should have a booster for tetanus every 10 years. Be sure to get your flu shot every year, since 5%-20% of the [place] population comes down with the flu. The flu vaccine changes each year, so being vaccinated once is not enough. Get your shot in the fall, before the flu season peaks.   Other vaccines to consider:  Human Papilloma Virus or HPV causes cancer of the cervix, and other infections that can be transmitted from person to person. There is a vaccine for HPV, and females should get immunized between the ages of 57 and 99. It requires a series of 3 shots.   Pneumococcal vaccine to protect against certain types of pneumonia.  This is normally recommended for adults age 81 or older.  However, adults younger than 31 years old with certain underlying conditions such as diabetes, heart or lung disease should also receive the vaccine.  Shingles vaccine to protect against Varicella Zoster if you are older than age 40, or younger than 31 years old with certain underlying illness.  Hepatitis A vaccine to protect against a form of infection of the liver by a virus acquired from food.  Hepatitis B vaccine to protect against a form of infection of the liver by a virus acquired from blood or body fluids, particularly if you work in health care.  If you plan to travel internationally, check with your local health department for specific vaccination recommendations.  Cancer Screening:  Breast cancer screening is essential to preventive care for women. All women age 24 and older should perform a breast self-exam every month. At age 67 and older, women should have their caregiver complete a breast exam each year. Women at ages 14 and older should have a mammogram (x-ray film) of the breasts. Your caregiver can discuss how often you need mammograms.    Cervical cancer screening includes taking a Pap smear (sample of cells examined under a microscope) from the cervix (end of the  uterus). It also includes testing for HPV (Human Papilloma Virus, which can cause cervical cancer). Screening and a pelvic exam should begin at age 81, or 3 years after a woman becomes sexually active. Screening should occur every year, with a Pap smear but no HPV testing, up to age 50. After age 17, you should have a Pap smear every 3 years with HPV testing, if no HPV was found previously.   Most routine colon cancer screening begins at the age of 2. On a yearly basis, doctors may provide special easy to use take-home tests to check for hidden blood in the stool. Sigmoidoscopy or colonoscopy  can detect the earliest forms of colon cancer and is life saving. These tests use a small camera at the end of a tube to directly examine the colon. Speak to your caregiver about this at age 59, when routine screening begins (and is repeated every 5 years unless early forms of pre-cancerous polyps or small growths are found).

## 2017-05-19 NOTE — Progress Notes (Signed)
Subjective:    Patient ID: Angela Mann, female    DOB: 03-27-1986, 31 y.o.   MRN: [id number]  HPI Chief Complaint  Patient presents with  . new pt    new pt, non fasting cpe, has obgyn, declines flu shot   She is new to the practice and here for a complete physical exam. Previous medical care:  OB/GYN Deidre Ala at [facility].  States she had a vaginal delivery without any complications on July 20 2016. She is still breast feeding and states this is going well.   She is doing a 21 day fast for spiritual reasons. She is on day 2. She can eat fruits and vegetables, nuts, wheat bread. She had a smoothie this morning and in not fasting.  Would like to return for labs.   Other providers: OB/GYN, [facility]. Dr. Orlinda Blalock orthodonitist.   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  Diet: healthy  Excerise: 3 days per week, high intensity.   Immunizations: flu shot - declines. Tdap up to date.   Health maintenance:  Mammogram: N/A Colonoscopy: N/A Last Gynecological Exam: at OB/GYN and up to date per patient.  Last Menstrual cycle: 02/2017. Since getting the [facility] in January 2018.  Last Dental Exam: twice annually  Last Eye Exam: years ago.   Wears seatbelt always, smoke detectors in home and functioning, does not text while driving and feels safe in home environment.   Reviewed allergies, medications, past medical, surgical, family, and social history.   Review of Systems Review of Systems Constitutional: -fever, -chills, -sweats, -unexpected weight change,-fatigue ENT: -runny nose, -ear pain, -sore throat Cardiology:  -chest pain, -palpitations, -edema Respiratory: -cough, -shortness of breath, -wheezing Gastroenterology: -abdominal pain, -nausea, -vomiting, -diarrhea, -constipation  Hematology: -bleeding or bruising problems Musculoskeletal: -arthralgias, -myalgias, -joint swelling, -back pain Ophthalmology: -vision  changes Urology: -dysuria, -difficulty urinating, -hematuria, -urinary frequency, -urgency Neurology: -headache, -weakness, -tingling, -numbness       Objective:   Physical Exam BP 110/70   Pulse 69   Ht  (1.651 m)   Wt 182 lb 3.2 oz (82.6 kg)   LMP 03/03/2017   Breastfeeding? Yes   BMI 30.32 kg/m   General Appearance:    Alert, cooperative, no distress, appears stated age  Head:    Normocephalic, without obvious abnormality, atraumatic  Eyes:    PERRL, conjunctiva/corneas clear, EOM's intact, fundi    benign  Ears:    Normal TM's and external ear canals  Nose:   Nares normal, mucosa normal, no drainage or sinus   tenderness  Throat:   Lips, mucosa, and tongue normal; teeth and gums normal  Neck:   Supple, no lymphadenopathy;  thyroid:  no   enlargement/tenderness/nodules; no carotid   bruit or JVD  Back:    Spine nontender, no curvature, ROM normal, no CVA     tenderness  Lungs:     Clear to auscultation bilaterally without wheezes, rales or     ronchi; respirations unlabored  Chest Wall:    No tenderness or deformity   Heart:    Regular rate and rhythm, S1 and S2 normal, no murmur, rub   or gallop  Breast Exam:    Done at OB/GYN  Abdomen:     Soft, non-tender, nondistended, normoactive bowel sounds,    no masses, no hepatosplenomegaly  Genitalia:    Done at OB/GYN  Rectal:    Not performed due to age<40 and no related complaints  Extremities:  No clubbing, cyanosis or edema  Pulses:   2+ and symmetric all extremities  Skin:   Skin color, texture, turgor normal, no rashes or lesions  Lymph nodes:   Cervical, supraclavicular, and axillary nodes normal  Neurologic:   CNII-XII intact, normal strength, sensation and gait; reflexes 2+ and symmetric throughout          Psych:   Normal mood, affect, hygiene and grooming.     Urinalysis dipstick: unable to obtain specimen.      Assessment & Plan:  Routine general medical examination at a health care facility - Plan: CBC  with Differential/Platelet, Comprehensive metabolic panel, TSH, Lipid panel, POCT Urinalysis DIP (Proadvantage Device), CANCELED: POCT Urinalysis DIP (Proadvantage Device)  Decreased visual acuity  She is doing well physically and emotionally.  Visual acuity is decreased 20/40, 20/40, 20/40 and she has been having some difficulty with her vision. Recommend she see an eye doctor.  Discussed health maintenance and safety.  Advise her to use caution while fasting and stay well hydrated.  She will return for fasting labs and urine dipstick.  Follow up pending labs.

## 2017-05-20 ENCOUNTER — Other Ambulatory Visit (INDEPENDENT_AMBULATORY_CARE_PROVIDER_SITE_OTHER): Payer: BLUE CROSS/BLUE SHIELD

## 2017-05-20 DIAGNOSIS — Z0000 Encounter for general adult medical examination without abnormal findings: Secondary | ICD-10-CM

## 2017-05-20 LAB — LIPID PANEL
CHOL/HDL RATIO: 3.8 (calc) (ref <5.0)
CHOLESTEROL: 188 mg/dL (ref <200)
HDL: 50 mg/dL — AB (ref >50)
LDL CHOLESTEROL (CALC): 123 mg/dL — AB
NON-HDL CHOLESTEROL (CALC): 138 mg/dL — AB (ref <130)
Triglycerides: 55 mg/dL (ref <150)

## 2017-05-20 LAB — POCT URINALYSIS DIP (PROADVANTAGE DEVICE)
BILIRUBIN UA: NEGATIVE
GLUCOSE UA: NEGATIVE mg/dL
Ketones, POC UA: NEGATIVE mg/dL
Leukocytes, UA: NEGATIVE
NITRITE UA: NEGATIVE
PH UA: 8 (ref 5.0–8.0)
Protein Ur, POC: NEGATIVE mg/dL
RBC UA: NEGATIVE
SPECIFIC GRAVITY, URINE: 1.015
UUROB: NEGATIVE

## 2017-05-20 LAB — COMPREHENSIVE METABOLIC PANEL
AG RATIO: 1.3 (calc) (ref 1.0–2.5)
ALT: 13 U/L (ref 6–29)
AST: 20 U/L (ref 10–30)
Albumin: 4 g/dL (ref 3.6–5.1)
Alkaline phosphatase (APISO): 78 U/L (ref 33–115)
BUN: 7 mg/dL (ref 7–25)
CHLORIDE: 107 mmol/L (ref 98–110)
CO2: 24 mmol/L (ref 20–32)
CREATININE: 0.77 mg/dL (ref 0.50–1.10)
Calcium: 9.1 mg/dL (ref 8.6–10.2)
GLOBULIN: 3.2 g/dL (ref 1.9–3.7)
GLUCOSE: 92 mg/dL (ref 65–99)
Potassium: 4.3 mmol/L (ref 3.5–5.3)
SODIUM: 139 mmol/L (ref 135–146)
TOTAL PROTEIN: 7.2 g/dL (ref 6.1–8.1)
Total Bilirubin: 0.6 mg/dL (ref 0.2–1.2)

## 2017-05-20 LAB — CBC WITH DIFFERENTIAL/PLATELET
BASOS PCT: 0.2 %
Basophils Absolute: 18 cells/uL (ref 0–200)
EOS PCT: 0.6 %
Eosinophils Absolute: 53 cells/uL (ref 15–500)
HCT: 41.2 % (ref 35.0–45.0)
HEMOGLOBIN: 13.7 g/dL (ref 11.7–15.5)
Lymphs Abs: 3573 cells/uL (ref 850–3900)
MCH: 28.5 pg (ref 27.0–33.0)
MCHC: 33.3 g/dL (ref 32.0–36.0)
MCV: 85.7 fL (ref 80.0–100.0)
MONOS PCT: 5 %
MPV: 10.9 fL (ref 7.5–12.5)
NEUTROS ABS: 4717 {cells}/uL (ref 1500–7800)
Neutrophils Relative %: 53.6 %
PLATELETS: 303 10*3/uL (ref 140–400)
RBC: 4.81 10*6/uL (ref 3.80–5.10)
RDW: 13.2 % (ref 11.0–15.0)
TOTAL LYMPHOCYTE: 40.6 %
WBC mixed population: 440 cells/uL (ref 200–950)
WBC: 8.8 10*3/uL (ref 3.8–10.8)

## 2017-05-20 LAB — TSH: TSH: 0.47 mIU/L

## 2017-06-08 DIAGNOSIS — H5213 Myopia, bilateral: Secondary | ICD-10-CM | POA: Diagnosis not present

## 2017-08-02 DIAGNOSIS — H6692 Otitis media, unspecified, left ear: Secondary | ICD-10-CM | POA: Diagnosis not present

## 2017-08-02 DIAGNOSIS — T7840XA Allergy, unspecified, initial encounter: Secondary | ICD-10-CM | POA: Diagnosis not present

## 2017-08-03 ENCOUNTER — Emergency Department (HOSPITAL_COMMUNITY)
Admission: EM | Admit: 2017-08-03 | Discharge: 2017-08-03 | Disposition: A | Discharge status: Home / Self Care | Payer: No Typology Code available for payment source | Attending: Emergency Medicine | Admitting: Emergency Medicine

## 2017-08-03 ENCOUNTER — Encounter (HOSPITAL_COMMUNITY): Payer: Self-pay | Admitting: Family Medicine

## 2017-08-03 ENCOUNTER — Other Ambulatory Visit: Payer: Self-pay

## 2017-08-03 DIAGNOSIS — H66002 Acute suppurative otitis media without spontaneous rupture of ear drum, left ear: Secondary | ICD-10-CM

## 2017-08-03 DIAGNOSIS — Z87891 Personal history of nicotine dependence: Secondary | ICD-10-CM | POA: Diagnosis not present

## 2017-08-03 DIAGNOSIS — R110 Nausea: Secondary | ICD-10-CM | POA: Diagnosis not present

## 2017-08-03 DIAGNOSIS — M25512 Pain in left shoulder: Secondary | ICD-10-CM | POA: Insufficient documentation

## 2017-08-03 DIAGNOSIS — R51 Headache: Secondary | ICD-10-CM | POA: Diagnosis not present

## 2017-08-03 DIAGNOSIS — Z041 Encounter for examination and observation following transport accident: Secondary | ICD-10-CM | POA: Diagnosis present

## 2017-08-03 DIAGNOSIS — R519 Headache, unspecified: Secondary | ICD-10-CM

## 2017-08-03 MED ORDER — PROCHLORPERAZINE MALEATE 10 MG PO TABS
10.0000 mg | ORAL_TABLET | Freq: Once | ORAL | Status: AC
  Administered 2017-08-03: 10 mg via ORAL
  Filled 2017-08-03: qty 1

## 2017-08-03 MED ORDER — CYCLOBENZAPRINE HCL 10 MG PO TABS: 10.0000 mg | ORAL_TABLET | Freq: Two times a day (BID) | ORAL | 0 refills | Status: DC | PRN

## 2017-08-03 MED ORDER — IBUPROFEN 600 MG PO TABS: 600.0000 mg | ORAL_TABLET | Freq: Four times a day (QID) | ORAL | 0 refills | Status: DC | PRN

## 2017-08-03 MED ORDER — KETOROLAC TROMETHAMINE 60 MG/2ML IM SOLN
30.0000 mg | Freq: Once | INTRAMUSCULAR | Status: AC
  Administered 2017-08-03: 30 mg via INTRAMUSCULAR
  Filled 2017-08-03: qty 2

## 2017-08-03 MED ORDER — ONDANSETRON HCL 4 MG PO TABS: 4.0000 mg | ORAL_TABLET | Freq: Three times a day (TID) | ORAL | 0 refills | Status: DC | PRN

## 2017-08-03 NOTE — ED Notes (Signed)
Called patient for triage with no answer.
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
## 2017-08-03 NOTE — Discharge Instructions (Signed)
Please follow with your primary care doctor in the next 2 days for a check-up. They must obtain records for further management.  ° °Do not hesitate to return to the Emergency Department for any new, worsening or concerning symptoms.  ° °

## 2017-08-03 NOTE — ED Notes (Signed)
Spoke with Reub[place]ole, [place] and she has agreed to see patient in fast track.
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
## 2017-08-12 ENCOUNTER — Encounter: Payer: Self-pay | Admitting: Family Medicine

## 2017-08-12 ENCOUNTER — Ambulatory Visit (INDEPENDENT_AMBULATORY_CARE_PROVIDER_SITE_OTHER): Payer: BLUE CROSS/BLUE SHIELD | Admitting: Family Medicine

## 2017-08-12 VITALS — BP 132/82 | HR 77 | Ht 66.0 in | Wt 189.0 lb

## 2017-08-12 DIAGNOSIS — R0981 Nasal congestion: Secondary | ICD-10-CM | POA: Diagnosis not present

## 2017-08-12 DIAGNOSIS — M545 Low back pain, unspecified: Secondary | ICD-10-CM

## 2017-08-12 DIAGNOSIS — H9192 Unspecified hearing loss, left ear: Secondary | ICD-10-CM | POA: Diagnosis not present

## 2017-08-12 DIAGNOSIS — M542 Cervicalgia: Secondary | ICD-10-CM

## 2017-08-12 NOTE — Patient Instructions (Addendum)
Try Sudafed or any decongestant for your nasal congestion. You may also want to try Flonase daily for the next week or so.   You can try heat on your back muscles. Ibuprofen as needed. Let me know how you are doing after seeing the chiropractor.   Follow up with me in 2 weeks.
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
## 2017-08-27 ENCOUNTER — Ambulatory Visit (INDEPENDENT_AMBULATORY_CARE_PROVIDER_SITE_OTHER): Payer: BLUE CROSS/BLUE SHIELD | Admitting: Family Medicine

## 2017-08-27 ENCOUNTER — Encounter: Payer: Self-pay | Admitting: Family Medicine

## 2017-08-27 VITALS — BP 124/82 | HR 80 | Ht 65.0 in | Wt 185.6 lb

## 2017-08-27 DIAGNOSIS — M545 Low back pain, unspecified: Secondary | ICD-10-CM

## 2017-08-27 DIAGNOSIS — H9192 Unspecified hearing loss, left ear: Secondary | ICD-10-CM

## 2017-08-27 DIAGNOSIS — M542 Cervicalgia: Secondary | ICD-10-CM
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
## 2017-09-08 ENCOUNTER — Ambulatory Visit: Payer: No Typology Code available for payment source | Attending: Physical Therapy | Admitting: Physical Therapy

## 2017-09-13 DIAGNOSIS — Z6831 Body mass index (BMI) 31.0-31.9, adult: Secondary | ICD-10-CM | POA: Diagnosis not present

## 2017-09-13 DIAGNOSIS — Z01419 Encounter for gynecological examination (general) (routine) without abnormal findings: Secondary | ICD-10-CM | POA: Diagnosis not present

## 2017-09-22 ENCOUNTER — Encounter: Payer: Self-pay | Admitting: Internal Medicine

## 2017-11-29 DIAGNOSIS — Z0279 Encounter for issue of other medical certificate: Secondary | ICD-10-CM

## 2017-12-17 ENCOUNTER — Ambulatory Visit (INDEPENDENT_AMBULATORY_CARE_PROVIDER_SITE_OTHER): Payer: BLUE CROSS/BLUE SHIELD | Admitting: Family Medicine

## 2017-12-17 ENCOUNTER — Encounter: Payer: Self-pay | Admitting: Family Medicine

## 2017-12-17 VITALS — BP 110/72 | HR 88 | Temp 98.5°F | Ht 64.75 in | Wt 184.0 lb

## 2017-12-17 DIAGNOSIS — J0140 Acute pansinusitis, unspecified: Secondary | ICD-10-CM | POA: Diagnosis not present

## 2017-12-17 DIAGNOSIS — H6692 Otitis media, unspecified, left ear: Secondary | ICD-10-CM

## 2017-12-17 MED ORDER — FLUTICASONE PROPIONATE 50 MCG/ACT NA SUSP: 2.0000 | Freq: Every day | NASAL | 2 refills | Status: DC

## 2017-12-17 MED ORDER — AMOXICILLIN-POT CLAVULANATE 875-125 MG PO TABS: 1.0000 | ORAL_TABLET | Freq: Two times a day (BID) | ORAL | 0 refills | Status: DC

## 2017-12-17 NOTE — Patient Instructions (Signed)
Start taking Claritin or Allegra or Xyzal or Zyrtec daily for allergies.  You should continue using Flonase or Nasort.   If you are not back to baseline after completing the antibiotic then let me know.

## 2017-12-17 NOTE — Progress Notes (Signed)
Subjective:  Angela Mann is a 32 y.o. female who presents for a 1 1/2 weeks of rhinorrhea, nasal congestion, sinus pressure, frontal headache, ears feeling clogged, post nasal drainage, and cough. States she feels like she is getting worse.   Denies fever, chills, sore throat, chest pain, shortness of breath, wheezing, N/V/D.   States she thinks she has allergies that have become more year round. Last antibiotic was in December.   Treatment to date: antihistamines, cough suppressants, decongestants and nasal steroids.  Denies sick contacts.  No other aggravating or relieving factors.  No other c/o.  ROS as in subjective.   Objective: Vitals:   12/17/17 0848  BP: 110/72  Pulse: 88  Temp: 98.5 F (36.9 C)  SpO2: 97%    General appearance: Alert, WD/WN, no distress, mildly ill appearing                             Skin: warm, no rash                           Head: + maxillary and ethmoid sinus tenderness                            Eyes: conjunctiva normal, corneas clear, PERRLA                            Ears: Left TM with erythema, dull and retracted, Right TM with erythema, landmarks visualized, external ear canals normal                          Nose: septum midline, turbinates swollen, with erythema and copious amounts of thick yellowish-green discharge             Mouth/throat: MMM, tongue normal, mild pharyngeal erythema, no edema or exduate                           Neck: supple, no adenopathy, no thyromegaly, nontender                          Heart: RRR, normal S1, S2, no murmurs                         Lungs: CTA bilaterally, no wheezes, rales, or rhonchi      Assessment: Acute pansinusitis, recurrence not specified - Plan: amoxicillin-clavulanate (AUGMENTIN) 875-125 MG tablet  Acute otitis media, left - Plan: amoxicillin-clavulanate (AUGMENTIN) 875-125 MG tablet    Plan: Discussed diagnosis and treatment of acute otitis media and sinusitis. Augmentin prescribed.  Flonase also prescribed per patient request.  Suggested symptomatic OTC remedies.Nasal saline spray for congestion.  Tylenol or Ibuprofen OTC for fever and malaise.  Call/return if not back to baseline after completing the antibiotic or if symptoms worsen.

## 2018-02-22 ENCOUNTER — Other Ambulatory Visit: Payer: Self-pay

## 2018-02-22 ENCOUNTER — Emergency Department (HOSPITAL_COMMUNITY): Payer: BLUE CROSS/BLUE SHIELD

## 2018-02-22 ENCOUNTER — Emergency Department (HOSPITAL_COMMUNITY)
Admission: EM | Admit: 2018-02-22 | Discharge: 2018-02-23 | Disposition: A | Discharge status: Home / Self Care | Payer: BLUE CROSS/BLUE SHIELD | Attending: Emergency Medicine | Admitting: Emergency Medicine

## 2018-02-22 ENCOUNTER — Encounter (HOSPITAL_COMMUNITY): Payer: Self-pay | Admitting: Emergency Medicine

## 2018-02-22 DIAGNOSIS — Z79899 Other long term (current) drug therapy: Secondary | ICD-10-CM | POA: Diagnosis not present

## 2018-02-22 DIAGNOSIS — S199XXA Unspecified injury of neck, initial encounter: Secondary | ICD-10-CM | POA: Diagnosis present

## 2018-02-22 DIAGNOSIS — S161XXA Strain of muscle, fascia and tendon at neck level, initial encounter: Secondary | ICD-10-CM | POA: Diagnosis not present

## 2018-02-22 DIAGNOSIS — Y999 Unspecified external cause status: Secondary | ICD-10-CM | POA: Insufficient documentation

## 2018-02-22 DIAGNOSIS — Z87891 Personal history of nicotine dependence: Secondary | ICD-10-CM | POA: Diagnosis not present

## 2018-02-22 DIAGNOSIS — Y939 Activity, unspecified: Secondary | ICD-10-CM | POA: Diagnosis not present

## 2018-02-22 DIAGNOSIS — M542 Cervicalgia: Secondary | ICD-10-CM | POA: Diagnosis not present

## 2018-02-22 DIAGNOSIS — Y929 Unspecified place or not applicable: Secondary | ICD-10-CM | POA: Insufficient documentation

## 2018-02-22 NOTE — ED Notes (Signed)
Patient transported to X-ray 
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
## 2018-02-23 ENCOUNTER — Encounter: Payer: Self-pay | Admitting: Medical

## 2018-02-23 ENCOUNTER — Ambulatory Visit (INDEPENDENT_AMBULATORY_CARE_PROVIDER_SITE_OTHER): Payer: BLUE CROSS/BLUE SHIELD | Admitting: Medical

## 2018-02-23 VITALS — BP 120/78 | HR 70 | Temp 97.8°F | Wt 184.8 lb

## 2018-02-23 DIAGNOSIS — M542 Cervicalgia: Secondary | ICD-10-CM | POA: Insufficient documentation

## 2018-02-23 DIAGNOSIS — M25561 Pain in right knee: Secondary | ICD-10-CM | POA: Insufficient documentation

## 2018-02-23 DIAGNOSIS — M62838 Other muscle spasm: Secondary | ICD-10-CM

## 2018-02-23 DIAGNOSIS — S161XXA Strain of muscle, fascia and tendon at neck level, initial encounter: Secondary | ICD-10-CM

## 2018-02-23 MED ORDER — TRAMADOL HCL 50 MG PO TABS: 50.0000 mg | ORAL_TABLET | Freq: Four times a day (QID) | ORAL | 0 refills | Status: DC | PRN

## 2018-02-23 MED ORDER — IBUPROFEN 800 MG PO TABS: 800.0000 mg | ORAL_TABLET | Freq: Three times a day (TID) | ORAL | 0 refills | Status: DC | PRN

## 2018-02-23 MED ORDER — CYCLOBENZAPRINE HCL 10 MG PO TABS: 10.0000 mg | ORAL_TABLET | Freq: Every evening | ORAL | 0 refills | Status: DC | PRN
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
## 2018-02-23 NOTE — Discharge Instructions (Signed)
Return here as needed. Follow up with your doctor for a recheck. °

## 2018-03-07 ENCOUNTER — Other Ambulatory Visit: Payer: Self-pay

## 2018-03-07 ENCOUNTER — Ambulatory Visit: Payer: BLUE CROSS/BLUE SHIELD | Attending: Medical

## 2018-03-07 DIAGNOSIS — M25561 Pain in right knee: Secondary | ICD-10-CM

## 2018-03-07 DIAGNOSIS — M542 Cervicalgia: Secondary | ICD-10-CM | POA: Diagnosis not present

## 2018-03-07 DIAGNOSIS — M62838 Other muscle spasm: Secondary | ICD-10-CM | POA: Diagnosis not present

## 2018-03-07 NOTE — Patient Instructions (Signed)
Neck side bend and rotation 2x/day 2-3 reps 10 sec 1x full ROM if able ,   LTR/ knee to chest x 2 RT. LT 10-20 sec 2-3x/day  Sit with suport and do neck retraction x 2 every hour
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
## 2018-03-09 ENCOUNTER — Ambulatory Visit: Payer: BLUE CROSS/BLUE SHIELD | Admitting: Physical Therapy

## 2018-03-09 ENCOUNTER — Encounter: Payer: Self-pay | Admitting: Physical Therapy

## 2018-03-09 DIAGNOSIS — M542 Cervicalgia: Secondary | ICD-10-CM | POA: Diagnosis not present

## 2018-03-09 DIAGNOSIS — M62838 Other muscle spasm: Secondary | ICD-10-CM | POA: Diagnosis not present

## 2018-03-09 DIAGNOSIS — M25561 Pain in right knee: Secondary | ICD-10-CM | POA: Diagnosis not present
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
## 2018-03-14 ENCOUNTER — Other Ambulatory Visit: Payer: Self-pay | Admitting: Family Medicine

## 2018-03-15 ENCOUNTER — Ambulatory Visit: Payer: BLUE CROSS/BLUE SHIELD | Admitting: Physical Therapy

## 2018-03-15 ENCOUNTER — Encounter: Payer: Self-pay | Admitting: Physical Therapy

## 2018-03-15 DIAGNOSIS — M542 Cervicalgia: Secondary | ICD-10-CM | POA: Diagnosis not present

## 2018-03-15 DIAGNOSIS — M62838 Other muscle spasm: Secondary | ICD-10-CM | POA: Diagnosis not present

## 2018-03-15 DIAGNOSIS — M25561 Pain in right knee: Secondary | ICD-10-CM

## 2018-03-15 NOTE — Patient Instructions (Addendum)
Sleeping on Back  Place pillow under knees. A pillow with cervical support and a roll around waist are also helpful. Copyright  VHI. All rights reserved.  Sleeping on Side Place pillow between knees. Use cervical support under neck and a roll around waist as needed. Copyright  VHI. All rights reserved.   Sleeping on Stomach   If this is the only desirable sleeping position, place pillow under lower legs, and under stomach or chest as needed.  Posture - Sitting   Sit upright, head facing forward. Try using a roll to support lower back. Keep shoulders relaxed, and avoid rounded back. Keep hips level with knees. Avoid crossing legs for long periods. Stand to Sit / Sit to Stand   To sit: Bend knees to lower self onto front edge of chair, then scoot back on seat. To stand: Reverse sequence by placing one foot forward, and scoot to front of seat. Use rocking motion to stand up.   Work Height and Reach  Ideal work height is no more than 2 to 4 inches below elbow level when standing, and at elbow level when sitting. Reaching should be limited to arm's length, with elbows slightly bent.  Bending  Bend at hips and knees, not back. Keep feet shoulder-width apart.    Posture - Standing   Good posture is important. Avoid slouching and forward head thrust. Maintain curve in low back and align ears over shoul- ders, hips over ankles.  Alternating Positions   Alternate tasks and change positions frequently to reduce fatigue and muscle tension. Take rest breaks. Computer Work   Position work to face forward. Use proper work and seat height. Keep shoulders back and down, wrists straight, and elbows at right angles. Use chair that provides full back support. Add footrest and lumbar roll as needed.  Getting Into / Out of Car  Lower self onto seat, scoot back, then bring in one leg at a time. Reverse sequence to get out.  Dressing  Lie on back to pull socks or slacks over feet, or sit  and bend leg while keeping back straight.    Housework - Sink  Place one foot on ledge of cabinet under sink when standing at sink for prolonged periods.   Pushing / Pulling  Pushing is preferable to pulling. Keep back in proper alignment, and use leg muscles to do the work.  Deep Squat   Squat and lift with both arms held against upper trunk. Tighten stomach muscles without holding breath. Use smooth movements to avoid jerking.  Avoid Twisting   Avoid twisting or bending back. Pivot around using foot movements, and bend at knees if needed when reaching for articles.  Carrying Luggage   Distribute weight evenly on both sides. Use a cart whenever possible. Do not twist trunk. Move body as a unit.   Lifting Principles .Maintain proper posture and head alignment. .Slide object as close as possible before lifting. .Move obstacles out of the way. .Test before lifting; ask for help if too heavy. .Tighten stomach muscles without holding breath. .Use smooth movements; do not jerk. .Use legs to do the work, and pivot with feet. .Distribute the work load symmetrically and close to the center of trunk. .Push instead of pull whenever possible.   Ask For Help   Ask for help and delegate to others when possible. Coordinate your movements when lifting together, and maintain the low back curve.  Log Roll   Lying on back, bend left knee and place left   arm across chest. Roll all in one movement to the right. Reverse to roll to the left. Always move as one unit. Housework - Sweeping  Use long-handled equipment to avoid stooping.   Housework - Wiping  Position yourself as close as possible to reach work surface. Avoid straining your back.  Laundry - Unloading Wash   To unload small items at bottom of washer, lift leg opposite to arm being used to reach.  Gardening - Raking  Move close to area to be raked. Use arm movements to do the work. Keep back straight and avoid  twisting.     Cart  When reaching into cart with one arm, lift opposite leg to keep back straight.   Getting Into / Out of Bed  Lower self to lie down on one side by raising legs and lowering head at the same time. Use arms to assist moving without twisting. Bend both knees to roll onto back if desired. To sit up, start from lying on side, and use same move-ments in reverse. Housework - Vacuuming  Hold the vacuum with arm held at side. Step back and forth to move it, keeping head up. Avoid twisting.   Laundry - Armed forces training and education officerLoading Wash  Position laundry basket so that bending and twisting can be avoided.   Laundry - Unloading Dryer  Squat down to reach into clothes dryer or use a reacher.  Gardening - Weeding / Psychiatric nurselanting  Squat or Kneel. Knee pads may be helpful.                        Quad Sets    Slowly tighten thigh muscles of straight, left leg while counting out loud to 5__. Relax. Repeat __10__ times. Do 1-2 times a day   KNEE: Extension, Short Arc Quads - Supine           .     Copyright  VHI. All rights reserved. Chair Knee Flexion    Keeping feet on floor, slide foot of right leg back, bending knee. Hold _5___ seconds. Repeat __10- 20__ times. Do _1-3___ sessions a day.    .    http://gt2.exer.us/293   Copyright  VHI. All rights reserved.  Calf Stretch    Place one leg forward, bent, other leg behind and straight. Lean forward keeping back heel flat. Hold ___30_ seconds while counting out loud. Repeat with other leg forward. Repeat ___3_ times. Do __1__ sessions per day.      GentleLeg Extension (Hamstring)    Sit toward front edge of chair, with leg out straight, heel on floor, toes pointing toward body. Keeping back straight, bend forward at hip until you feel a gentle stretch. Return. Repeat __3_ times. Repeat with other leg. Do _1__ sessions per day.   Copyright  VHI. All rights reserved.     http://gt2.exer.us/478   Copyright  VHI. All rights reserved.

## 2018-03-15 NOTE — Therapy (Signed)
[facility] Outpatient Rehabilitation [facility] [address] [place], [place], [address] Phone: [phone]   Fax:  [phone]  Physical Therapy Treatment  Patient Details  Name: Angela Mann MRN: [id number] Date of Birth: 27-Oct-1985 Referring Provider: Crosby Oyster PA   Encounter Date: 03/15/2018  PT End of Session - 03/15/18 1816    Visit Number  3    Number of Visits  12    Date for PT Re-Evaluation  04/15/18    PT Start Time  1419    PT Stop Time  1514    PT Time Calculation (min)  55 min    Activity Tolerance  Patient tolerated treatment well    Behavior During Therapy  [facility] for tasks assessed/performed       Past Medical History:  Diagnosis Date  . Abnormal Pap smear    h/o LEEP  . HSV (herpes simplex virus) anogenital infection   . Late prenatal care    17 weeks  . Trichomonas    2013    Past Surgical History:  Procedure Laterality Date  . cold knife conization     from abnormal pap  . LEEP      There were no vitals filed for this visit.  Subjective Assessment - 03/15/18 1425    Subjective  Tape helped me do the exercises and driving.   I have more motions side to side.  No HA today  i massage the HA away.    Currently in Pain?  Yes    Pain Score  6     Pain Location  Neck    Pain Orientation  Right;Left;Posterior    Pain Descriptors / Indicators  Sore;Headache    Pain Radiating Towards  lateral bilateral    Aggravating Factors   looking down,  other motions    Pain Relieving Factors  Tape , warm showers,      Effect of Pain on Daily Activities  wakes    Pain Score  6    Pain Location  Knee    Pain Orientation  Right    Pain Descriptors / Indicators  Aching    Pain Type  Acute pain    Pain Frequency  Intermittent    Aggravating Factors   walking    Pain Relieving Factors  over the counter    Effect of Pain on Daily Activities  a little limp mid back 5-5.5/10         [facility] PT Assessment - 03/15/18 0001      AROM   Overall AROM Comments  64 AROM knee right flexion,  0 degrees extension                   [facility] Adult PT Treatment/Exercise - 03/15/18 0001      Self-Care   Self-Care  ADL's;Lifting;Posture      Knee/Hip Exercises: Stretches   Passive Hamstring Stretch  3 reps;30 seconds seated.  HEP    Gastroc Stretch  3 reps;30 seconds      Knee/Hip Exercises: Seated   Heel Slides  10 reps    Heel Slides Limitations  pain ,  ROM,  HEP      Knee/Hip Exercises: Supine   Quad Sets  10 reps cued.  HEP    Short Arc [employer]  5 reps;2 sets 1 set pillow squeeze.  HEP    Heel Slides  10 reps strap used,  difficult      Modalities   Modalities  Moist  Heat      Moist Heat Therapy   Number Minutes Moist Heat  15 Minutes    Moist Heat Location  Cervical;Knee upper back             PT Education - 03/15/18 1457    Education Details  self care,  HEP knee    Person(s) Educated  Patient    Methods  Explanation;Demonstration;Tactile cues;Verbal cues;Handout    Comprehension  Verbalized understanding;Returned demonstration;Need further instruction       PT Short Term Goals - 03/07/18 1324      PT SHORT TERM GOAL #1   Title  She will be independent with initial HEP     Time  3    Period  Weeks    Status  New      PT SHORT TERM GOAL #2   Title  Neck pain decr 30% or more and ROM at least 50% normal    Time  3    Period  Weeks    Status  New      PT SHORT TERM GOAL #3   Title  Thigh and knee pain decr 30% or more and knee ROM active 130 degree or more    Time  3    Period  Weeks    Status  New      PT SHORT TERM GOAL #4   Title  She will demo understanding of good posture.         PT Long Term Goals - 03/07/18 1331      PT LONG TERM GOAL #1   Title  she will be indpeendnet with all HEp issued    Time  6    Period  Weeks    Status  New      PT LONG TERM GOAL #2   Title  Pain will become intermittant and decr 60% or more    Time  6    Period  Weeks      PT  LONG TERM GOAL #3   Title  She will have full neck ROm with 1-2 max pain.    Time  6    Period  Weeks    Status  New      PT LONG TERM GOAL #4   Title  she will report able to stand and walk for normal home and community distances.     Time  6    Period  Weeks    Status  New      PT LONG TERM GOAL #5   Title  She will report able to sit with good posture with 1-2 max pain for 60 min for work activity    Time  6    Period  Weeks    Status  New            Plan - 03/15/18 1816    Clinical Impression Statement  Focus on Knee fro exercise today.  Exercises were all sore with slow / guarded movements.  ADL education continued with modifications needed due to sore legs.  Pain unchanged at end of session 5-6/10 in knee/ thigh.    PT Next Visit Plan  review HEP , Modalities and manual for pain and ROM.  SLR assess Tape,  Consider nustep or recumbant.    PT Home Exercise Plan  KtoC, LTR, cervical Rot/ side bend, good posture.  QS, SAQ, heel slides, calf and hamstring stretch    Consulted and Agree with Plan of Care  Patient       Patient will benefit from skilled therapeutic intervention in order to improve the following deficits and impairments:     Visit Diagnosis: Cervicalgia  Acute pain of right knee  Other muscle spasm     Problem List Patient Active Problem List   Diagnosis Date Noted  . Right knee pain 02/23/2018  . Neck pain 02/23/2018    Samson Ralph  PTA 03/15/2018, 6:20 PM  [facility] [address] [place], [place], [address] Phone: [phone]   Fax:  [phone]  Name: BRAYA HABERMEHL MRN: [id number] Date of Birth: 11/02/1985

## 2018-03-16 ENCOUNTER — Ambulatory Visit: Payer: BLUE CROSS/BLUE SHIELD | Admitting: Physical Therapy

## 2018-03-16 ENCOUNTER — Encounter: Payer: Self-pay | Admitting: Physical Therapy

## 2018-03-16 DIAGNOSIS — M25561 Pain in right knee: Secondary | ICD-10-CM | POA: Diagnosis not present

## 2018-03-16 DIAGNOSIS — M542 Cervicalgia: Secondary | ICD-10-CM | POA: Diagnosis not present

## 2018-03-16 DIAGNOSIS — M62838 Other muscle spasm: Secondary | ICD-10-CM

## 2018-03-16 NOTE — Therapy (Signed)
[facility] Outpatient Rehabilitation [facility] [address] [place], [place], [address] Phone: [phone]   Fax:  [phone]  Physical Therapy Treatment  Patient Details  Name: Angela Mann MRN: [id number] Date of Birth: [birth year] Referring Provider: Crosby Oyster PA   Encounter Date: 03/16/2018  PT End of Session - 03/16/18 1751    Visit Number  4    Number of Visits  12    Date for PT Re-Evaluation  04/15/18    PT Start Time  1521 short session, patient late,  and had heat    PT Stop Time  1600    PT Time Calculation (min)  39 min       Past Medical History:  Diagnosis Date  . Abnormal Pap smear    h/o LEEP  . HSV (herpes simplex virus) anogenital infection   . Late prenatal care    17 weeks  . Trichomonas    2013    Past Surgical History:  Procedure Laterality Date  . cold knife conization     from abnormal pap  . LEEP      There were no vitals filed for this visit.  Subjective Assessment - 03/16/18 1523    Currently in Pain?  Yes    Pain Score  6                        [facility] Adult PT Treatment/Exercise - 03/16/18 0001      Knee/Hip Exercises: Stretches   Passive Hamstring Stretch  2 reps;20 seconds gentle      Knee/Hip Exercises: Aerobic   Recumbent Bike  5 minutes unable to turn it on      Knee/Hip Exercises: Supine   Quad Sets  10 reps      Knee/Hip Exercises: Prone   Hamstring Curl  5 reps AA,  ROM improved  with reps ,  limited    Prone Knee Hang  1 minute with leg straight,  increased back pain      Modalities   Modalities  Moist Heat      Moist Heat Therapy   Number Minutes Moist Heat  15 Minutes    Moist Heat Location  Cervical;Knee upper back      Manual Therapy   Manual Therapy  Soft tissue mobilization    Soft tissue mobilization  hamstrings with foam noodle tissue softened    Kinesiotex  Facilitate Muscle      Kinesiotix   Facilitate Muscle   quads and hamstrings,  feels she could swing  leg better             PT Education - 03/15/18 1457    Education Details  self care,  HEP knee    Person(s) Educated  Patient    Methods  Explanation;Demonstration;Tactile cues;Verbal cues;Handout    Comprehension  Verbalized understanding;Returned demonstration;Need further instruction       PT Short Term Goals - 03/07/18 1324      PT SHORT TERM GOAL #1   Title  She will be independent with initial HEP     Time  3    Period  Weeks    Status  New      PT SHORT TERM GOAL #2   Title  Neck pain decr 30% or more and ROM at least 50% normal    Time  3    Period  Weeks    Status  New      PT SHORT TERM  GOAL #3   Title  Thigh and knee pain decr 30% or more and knee ROM active 130 degree or more    Time  3    Period  Weeks    Status  New      PT SHORT TERM GOAL #4   Title  She will demo understanding of good posture.         PT Long Term Goals - 03/07/18 1331      PT LONG TERM GOAL #1   Title  she will be indpeendnet with all HEp issued    Time  6    Period  Weeks    Status  New      PT LONG TERM GOAL #2   Title  Pain will become intermittant and decr 60% or more    Time  6    Period  Weeks      PT LONG TERM GOAL #3   Title  She will have full neck ROm with 1-2 max pain.    Time  6    Period  Weeks    Status  New      PT LONG TERM GOAL #4   Title  she will report able to stand and walk for normal home and community distances.     Time  6    Period  Weeks    Status  New      PT LONG TERM GOAL #5   Title  She will report able to sit with good posture with 1-2 max pain for 60 min for work activity    Time  6    Period  Weeks    Status  New            Plan - 03/15/18 1816    Clinical Impression Statement  Focus on Knee fro exercise today.  Exercises were all sore with slow / guarded movements.  ADL education continued with modifications needed due to sore legs.  Pain unchanged at end of session 5-6/10 in knee/ thigh.    PT Next Visit Plan   review HEP , Modalities and manual for pain and ROM.  SLR assess Tape,  Consider nustep or recumbant.    PT Home Exercise Plan  KtoC, LTR, cervical Rot/ side bend, good posture.  QS, SAQ, heel slides, calf and hamstring stretch    Consulted and Agree with Plan of Care  Patient       Patient will benefit from skilled therapeutic intervention in order to improve the following deficits and impairments:     Visit Diagnosis: Cervicalgia  Acute pain of right knee  Other muscle spasm     Problem List Patient Active Problem List   Diagnosis Date Noted  . Right knee pain 02/23/2018  . Neck pain 02/23/2018    HARRIS,KAREN PTA 03/16/2018, 5:54 PM  [facility] [address] [place], [place], [address] Phone: [phone]   Fax:  [phone]  Name: Angela Mann MRN: [id number] Date of Birth: June 06, 1986

## 2018-03-24 ENCOUNTER — Ambulatory Visit: Payer: BLUE CROSS/BLUE SHIELD | Attending: Medical

## 2018-03-24 DIAGNOSIS — M62838 Other muscle spasm: Secondary | ICD-10-CM

## 2018-03-24 DIAGNOSIS — M542 Cervicalgia: Secondary | ICD-10-CM | POA: Insufficient documentation

## 2018-03-24 DIAGNOSIS — M25561 Pain in right knee: Secondary | ICD-10-CM | POA: Insufficient documentation

## 2018-03-24 NOTE — Therapy (Signed)
[facility] Outpatient Rehabilitation [facility] [address] [place], [place], [address] Phone: [phone]   Fax:  [phone]  Physical Therapy Treatment  Patient Details  Name: Angela Mann MRN: [id number] Date of Birth: September 06, 1985 Referring Provider: Crosby Oyster PA   Encounter Date: 03/24/2018  PT End of Session - 03/24/18 1639    Visit Number  5    Number of Visits  12    Date for PT Re-Evaluation  04/15/18    Authorization Type  [employer]    PT Start Time  0430    PT Stop Time  0520    PT Time Calculation (min)  50 min    Activity Tolerance  Patient tolerated treatment well    Behavior During Therapy  [facility] for tasks assessed/performed       Past Medical History:  Diagnosis Date  . Abnormal Pap smear    h/o LEEP  . HSV (herpes simplex virus) anogenital infection   . Late prenatal care    17 weeks  . Trichomonas    2013    Past Surgical History:  Procedure Laterality Date  . cold knife conization     from abnormal pap  . LEEP      There were no vitals filed for this visit.  Subjective Assessment - 03/24/18 1636    Subjective  PAin 5/10 today, a little stiff.  Exerciseing     Pain Score  5     Pain Location  Neck    Pain Orientation  Right;Left    Pain Descriptors / Indicators  Sore   crick   Aggravating Factors   ROM    Pain Relieving Factors  tape    Pain Score  5    Pain Location  Knee    Pain Orientation  Right    Pain Descriptors / Indicators  Aching    Pain Type  Acute pain    Pain Onset  1 to 4 weeks ago    Pain Frequency  Constant    Aggravating Factors   walking    Pain Relieving Factors  med                       [facility] Adult PT Treatment/Exercise - 03/24/18 0001      Knee/Hip Exercises: Stretches   Passive Hamstring Stretch  Right;Left;1 rep;30 seconds    Other Knee/Hip Stretches  Knee to chest RT and Lt 2 x 20 sec assisted    Other Knee/Hip Stretches  hip abduction 30 sec       Knee/Hip Exercises:  Aerobic   Recumbent Bike  6 min unable to turn on      Knee/Hip Exercises: Supine   Short Arc Quad Sets  Both   12 reps     Moist Heat Therapy   Number Minutes Moist Heat  15 Minutes    Moist Heat Location  Lumbar Spine;Cervical   back/neck  also on with neck/leg exer& STW     Manual Therapy   Manual Therapy  Manual Traction    Soft tissue mobilization  to neck with sub occipital release. then ROM SB and rotation to full ROM.     Manual Traction  long axis traction and osscilations x 100 reps Gr 2-3               PT Short Term Goals - 03/24/18 1722      PT SHORT TERM GOAL #1   Title  She will  be independent with initial HEP     Status  On-going      PT SHORT TERM GOAL #2   Title  Neck pain decr 30% or more and ROM at least 50% normal    Status  On-going      PT SHORT TERM GOAL #3   Title  Thigh and knee pain decr 30% or more and knee ROM active 130 degree or more    Status  On-going      PT SHORT TERM GOAL #4   Title  She will demo understanding of good posture.     Status  On-going        PT Long Term Goals - 03/07/18 1331      PT LONG TERM GOAL #1   Title  she will be indpeendnet with all HEp issued    Time  6    Period  Weeks    Status  New      PT LONG TERM GOAL #2   Title  Pain will become intermittant and decr 60% or more    Time  6    Period  Weeks      PT LONG TERM GOAL #3   Title  She will have full neck ROm with 1-2 max pain.    Time  6    Period  Weeks    Status  New      PT LONG TERM GOAL #4   Title  she will report able to stand and walk for normal home and community distances.     Time  6    Period  Weeks    Status  New      PT LONG TERM GOAL #5   Title  She will report able to sit with good posture with 1-2 max pain for 60 min for work activity    Time  6    Period  Weeks    Status  New            Plan - 03/24/18 1640    Clinical Impression Statement  Doing better but I feel she still needs to be pushed for Active  ROM as PROM close to normal for all neck ROM.  Will impact back with hip and leg exercises and see if she has some benefit before asking for back order.    Marland Kitchen     PT Treatment/Interventions  Dry needling;Manual techniques;Patient/family education;Therapeutic activities;Therapeutic exercise;Electrical Stimulation;Moist Heat;Ultrasound;Traction;Taping;Passive range of motion    PT Next Visit Plan  review HEP , Modalities and manual for pain and ROM.  SLR assess Tape,  Consider nustep or recumbant.    PT Home Exercise Plan  KtoC, LTR, cervical Rot/ side bend, good posture.  QS, SAQ, heel slides, calf and hamstring stretch    Consulted and Agree with Plan of Care  Patient       Patient will benefit from skilled therapeutic intervention in order to improve the following deficits and impairments:  Decreased strength, Decreased range of motion, Decreased activity tolerance, Postural dysfunction, Increased muscle spasms, Pain, Difficulty walking  Visit Diagnosis: Cervicalgia  Acute pain of right knee  Other muscle spasm     Problem List Patient Active Problem List   Diagnosis Date Noted  . Right knee pain 02/23/2018  . Neck pain 02/23/2018    Caprice Red  PT 03/24/2018, 5:24 PM  [facility] [address] [place], [place], [address] Phone: [phone]   Fax:  [phone]  Name: Angela Mann MRN: [id number] Date of Birth: [birth year]

## 2018-03-28 ENCOUNTER — Ambulatory Visit: Payer: BLUE CROSS/BLUE SHIELD

## 2018-03-28 DIAGNOSIS — M25561 Pain in right knee: Secondary | ICD-10-CM

## 2018-03-28 DIAGNOSIS — M62838 Other muscle spasm: Secondary | ICD-10-CM | POA: Diagnosis not present

## 2018-03-28 DIAGNOSIS — M542 Cervicalgia: Secondary | ICD-10-CM | POA: Diagnosis not present

## 2018-03-28 NOTE — Therapy (Signed)
[facility] [address] [place], [place], [address] Phone: [phone]   Fax:  [phone]  Physical Therapy Treatment  Patient Details  Name: Angela Mann MRN: [id number] Date of Birth: [birth year] Referring Provider: Crosby Oysteravid Tysinger PA   Encounter Date: 03/28/2018  PT End of Session - 03/28/18 0829    Visit Number  6    Number of Visits  12    Date for PT Re-Evaluation  04/15/18    Authorization Type  BCBS    PT Start Time  0830   25 min late   PT Stop Time  0925    PT Time Calculation (min)  55 min    Activity Tolerance  Patient tolerated treatment well    Behavior During Therapy  [facility] for tasks assessed/performed       Past Medical History:  Diagnosis Date  . Abnormal Pap smear    h/o LEEP  . HSV (herpes simplex virus) anogenital infection   . Late prenatal care    17 weeks  . Trichomonas    2013    Past Surgical History:  Procedure Laterality Date  . cold knife conization     from abnormal pap  . LEEP      There were no vitals filed for this visit.  Subjective Assessment - 03/28/18 0840    Subjective  She reports more walking  with exer. Some better stretching helps. AM stiff and better after walking. 2 hours sitting pain starts.     Pain Score  --   4.5   Pain Location  Neck    Pain Orientation  Right;Left    Pain Descriptors / Indicators  Sore    Pain Type  Acute pain    Pain Radiating Towards  bilatera    Aggravating Factors   Heat ROM    Pain Score  --   4.5   Pain Location  Knee    Pain Orientation  Right;Lateral    Pain Descriptors / Indicators  Aching    Pain Type  Acute pain    Pain Onset  More than a month ago    Pain Frequency  Constant         [facility] PT Assessment - 03/28/18 0001      AROM   Right Knee Flexion  100   passive 140 degrees   Cervical Flexion  35    Cervical Extension  38    Cervical - Right Side Bend  30    Cervical - Left Side Bend  27    Cervical - Right  Rotation  40    Cervical - Left Rotation  40                   [facility] Adult PT Treatment/Exercise - 03/28/18 0001      Neck Exercises: Seated   Cervical Rotation  Right;Left;5 reps    Lateral Flexion  Right;Left;5 reps      Knee/Hip Exercises: Stretches   Passive Hamstring Stretch  Right;Left;1 rep;30 seconds    Other Knee/Hip Stretches  Knee to chest x 2 RT/Lt      Knee/Hip Exercises: Aerobic   Recumbent Bike  6 min unable to turn on      Knee/Hip Exercises: Supine   Quad Sets  Right;Left;5 sets    Short Arc Quad Sets  Right;Left;20 reps;Limitations    Short Arc [employer]ad Sets Limitations  3 pounds    [employer]  15 reps  Straight Leg Raises  Right;Left;10 reps      Moist Heat Therapy   Number Minutes Moist Heat  15 Minutes    Moist Heat Location  Cervical      Manual Therapy   Manual Therapy  Passive ROM    Soft tissue mobilization  to neck with sub occipital release. then ROM SB and rotation to full ROM.     Passive ROM  SLR x 3 90 degrees    Manual Traction  long axis traction and osscilations x 100 reps Gr 2-3 RT               PT Short Term Goals - 03/24/18 1722      PT SHORT TERM GOAL #1   Title  She will be independent with initial HEP     Status  On-going      PT SHORT TERM GOAL #2   Title  Neck pain decr 30% or more and ROM at least 50% normal    Status  On-going      PT SHORT TERM GOAL #3   Title  Thigh and knee pain decr 30% or more and knee ROM active 130 degree or more    Status  On-going      PT SHORT TERM GOAL #4   Title  She will demo understanding of good posture.     Status  On-going        PT Long Term Goals - 03/07/18 1331      PT LONG TERM GOAL #1   Title  she will be indpeendnet with all HEp issued    Time  6    Period  Weeks    Status  New      PT LONG TERM GOAL #2   Title  Pain will become intermittant and decr 60% or more    Time  6    Period  Weeks      PT LONG TERM GOAL #3   Title  She will have full neck  ROm with 1-2 max pain.    Time  6    Period  Weeks    Status  New      PT LONG TERM GOAL #4   Title  she will report able to stand and walk for normal home and community distances.     Time  6    Period  Weeks    Status  New      PT LONG TERM GOAL #5   Title  She will report able to sit with good posture with 1-2 max pain for 60 min for work activity    Time  6    Period  Weeks    Status  New            Plan - 03/28/18 5784    Clinical Impression Statement  Knee ROM normal passively. Still avoiding pain as able .  Sitting  longer with less pain.   Neck ROM cont to be full ROM passive. Able to do HEP with limited ROM    PT Treatment/Interventions  Dry needling;Manual techniques;Patient/family education;Therapeutic activities;Therapeutic exercise;Electrical Stimulation;Moist Heat;Ultrasound;Traction;Taping;Passive range of motion    PT Next Visit Plan   , Modalities and manual for pain and ROM.  SLR      PT Home Exercise Plan  KtoC, LTR, cervical Rot/ side bend, good posture.  QS, SAQ, heel slides, calf and hamstring stretch    Consulted and Agree with Plan of Care  Patient       Patient will benefit from skilled therapeutic intervention in order to improve the following deficits and impairments:  Decreased strength, Decreased range of motion, Decreased activity tolerance, Postural dysfunction, Increased muscle spasms, Pain, Difficulty walking  Visit Diagnosis: Cervicalgia  Acute pain of right knee  Other muscle spasm     Problem List Patient Active Problem List   Diagnosis Date Noted  . Right knee pain 02/23/2018  . Neck pain 02/23/2018    Caprice RedChasse, Mellany Dinsmore M PT 03/28/2018, 9:25 AM  [facility] [address] [place], [place], [address] Phone: [phone]   Fax:  [phone]  Name: Angela Mann MRN: [id number] Date of Birth: 04/09/1986

## 2018-03-31 ENCOUNTER — Ambulatory Visit: Payer: BLUE CROSS/BLUE SHIELD

## 2018-03-31 DIAGNOSIS — M62838 Other muscle spasm: Secondary | ICD-10-CM | POA: Diagnosis not present

## 2018-03-31 DIAGNOSIS — M542 Cervicalgia: Secondary | ICD-10-CM | POA: Diagnosis not present

## 2018-03-31 DIAGNOSIS — M25561 Pain in right knee: Secondary | ICD-10-CM | POA: Diagnosis not present

## 2018-03-31 NOTE — Therapy (Signed)
[place], [place], [address] Phone: [phone]   Fax:  [phone]  Physical Therapy Treatment  Patient Details  Name: Angela Mann MRN: [id number] Date of Birth: 17-Nov-1985 Referring Provider: Chana Bode PA   Encounter Date: 03/31/2018  PT End of Session - 03/31/18 1656    Visit Number  7    Number of Visits  12    Date for PT Re-Evaluation  04/15/18    Authorization Type  BCBS    PT Start Time  0435    PT Stop Time  0520    PT Time Calculation (min)  45 min    Activity Tolerance  Patient tolerated treatment well    Behavior During Therapy  [facility] for tasks assessed/performed       Past Medical History:  Diagnosis Date  . Abnormal Pap smear    h/o LEEP  . HSV (herpes simplex virus) anogenital infection   . Late prenatal care    17 weeks  . Trichomonas    2013    Past Surgical History:  Procedure Laterality Date  . cold knife conization     from abnormal pap  . LEEP      There were no vitals filed for this visit.  Subjective Assessment - 03/31/18 1654    Subjective  Doing some better but sore in legs from stretching.     Pain Score  4     Pain Location  Neck    Pain Orientation  Right;Left    Pain Descriptors / Indicators  Sore    Pain Type  Acute pain    Pain Radiating Towards  bilateral    Pain Score  3    Pain Location  Knee    Pain Orientation  Right;Left    Pain Descriptors / Indicators  Aching    Pain Type  Acute pain    Pain Onset  More than a month ago    Pain Frequency  Constant                       [facility] Adult PT Treatment/Exercise - 03/31/18 0001      Neck Exercises: Theraband   Shoulder Extension  10 reps   yellow   Shoulder External Rotation  10 reps   yellow   Other Theraband Exercises  abduction hands by hips x 12 yellow band . Cued to keep range small.       Knee/Hip Exercises: Aerobic   Recumbent Bike  6 min L1  Then UBE L1 3 min       Moist Heat Therapy   Number Minutes Moist Heat  15 Minutes    Moist Heat Location  Cervical;Knee      Manual Therapy   Soft tissue mobilization  to neck with sub occipital release. then ROM SB and rotation to full ROM.                PT Short Term Goals - 03/31/18 1720      PT SHORT TERM GOAL #1   Title  She will be independent with initial HEP     Status  Achieved      PT SHORT TERM GOAL #2   Title  Neck pain decr 30% or more and ROM at least 50% normal    Baseline  improved but goal not met    Status  On-going      PT SHORT TERM GOAL #3  Title  Thigh and knee pain decr 30% or more and knee ROM active 130 degree or more    Baseline  improved but not 30%    Status  On-going      PT SHORT TERM GOAL #4   Title  She will demo understanding of good posture.     Baseline  she was able to to demo but reports pain    Status  Achieved        PT Long Term Goals - 03/07/18 1331      PT LONG TERM GOAL #1   Title  she will be indpeendnet with all HEp issued    Time  6    Period  Weeks    Status  New      PT LONG TERM GOAL #2   Title  Pain will become intermittant and decr 60% or more    Time  6    Period  Weeks      PT LONG TERM GOAL #3   Title  She will have full neck ROm with 1-2 max pain.    Time  6    Period  Weeks    Status  New      PT LONG TERM GOAL #4   Title  she will report able to stand and walk for normal home and community distances.     Time  6    Period  Weeks    Status  New      PT LONG TERM GOAL #5   Title  She will report able to sit with good posture with 1-2 max pain for 60 min for work activity    Time  6    Period  Weeks    Status  New            Plan - 03/31/18 1644    Clinical Impression Statement  Improving as she is  tolerating PROM neck and loading to neck and knees with exercises with less complaint. Still limited active but less complaint of pain.   We need to conntinue to gently push ROM and strnegthening.    PT  Treatment/Interventions  Dry needling;Manual techniques;Patient/family education;Therapeutic activities;Therapeutic exercise;Electrical Stimulation;Moist Heat;Ultrasound;Traction;Taping;Passive range of motion    PT Next Visit Plan   , Modalities and manual for pain and ROM, strength.  SLR   Extend after next week    PT Home Exercise Plan  KtoC, LTR, cervical Rot/ side bend, good posture.  QS, SAQ, heel slides, calf and hamstring stretch    Consulted and Agree with Plan of Care  Patient       Patient will benefit from skilled therapeutic intervention in order to improve the following deficits and impairments:  Decreased strength, Decreased range of motion, Decreased activity tolerance, Postural dysfunction, Increased muscle spasms, Pain, Difficulty walking  Visit Diagnosis: Cervicalgia  Acute pain of right knee  Other muscle spasm     Problem List Patient Active Problem List   Diagnosis Date Noted  . Right knee pain 02/23/2018  . Neck pain 02/23/2018    Darrel Hoover, PT 03/31/2018, 5:24 PM  [facility] [address] [place], [place], [address] Phone: [phone]   Fax:  [phone]  Name: Angela Mann MRN: [id number] Date of Birth: [birth year]

## 2018-04-04 ENCOUNTER — Ambulatory Visit: Payer: BLUE CROSS/BLUE SHIELD | Admitting: Physical Therapy

## 2018-04-04 ENCOUNTER — Encounter: Payer: Self-pay | Admitting: Physical Therapy

## 2018-04-04 DIAGNOSIS — M62838 Other muscle spasm: Secondary | ICD-10-CM | POA: Diagnosis not present

## 2018-04-04 DIAGNOSIS — M542 Cervicalgia: Secondary | ICD-10-CM

## 2018-04-04 DIAGNOSIS — M25561 Pain in right knee: Secondary | ICD-10-CM | POA: Diagnosis not present

## 2018-04-04 NOTE — Therapy (Addendum)
[place], [place], [address] Phone: [phone]   Fax:  [phone]  Physical Therapy Treatment/ discharge  Patient Details  Name: Angela Mann MRN: [id number] Date of Birth: 07/20/1986 Referring Provider: Chana Bode PA   Encounter Date: 04/04/2018  PT End of Session - 04/04/18 1613    Visit Number  8    Number of Visits  12    Date for PT Re-Evaluation  04/15/18    PT Start Time  0805    PT Stop Time  0900    PT Time Calculation (min)  55 min    Activity Tolerance  Patient tolerated treatment well    Behavior During Therapy  [facility] for tasks assessed/performed       Past Medical History:  Diagnosis Date  . Abnormal Pap smear    h/o LEEP  . HSV (herpes simplex virus) anogenital infection   . Late prenatal care    17 weeks  . Trichomonas    2013    Past Surgical History:  Procedure Laterality Date  . cold knife conization     from abnormal pap  . LEEP      There were no vitals filed for this visit.  Subjective Assessment - 04/04/18 0811    Subjective  Neck is 5/10  and 75% better.  Knee is a little better. Sleeping is a little better.    Currently in Pain?  Yes    Pain Score  5     Pain Location  Neck    Pain Orientation  Right;Left    Pain Descriptors / Indicators  Sore;Headache;Throbbing    Pain Type  Acute pain    Pain Radiating Towards  Upper traps    Aggravating Factors     face up,  rotation and flexion.  back support chair,  fast movements ,  turning    Pain Relieving Factors  heat,  laying down  resting head on hand.  ibuprophen    Effect of Pain on Daily Activities  wakes 2 X a night,  HA last all day  2 X a week      Pain Location  Knee    Pain Descriptors / Indicators  --   knee catches   Aggravating Factors   standing too long,  sitting without moving,  first thing in the morning,    Pain Relieving Factors  getting up every hour moving,  ibuprophen                        [facility] Adult PT Treatment/Exercise - 04/04/18 0001      Self-Care   Self-Care  Posture    Posture  education continued  work .  home  discussed      Neck Exercises: Seated   Cervical Rotation  --   YOGA   whole trunk stretch  3 x right /  left,  cues   Other Seated Exercise  face up with head in sling to improve ROM 5 X      Knee/Hip Exercises: Stretches   Passive Hamstring Stretch  3 reps;30 seconds      Knee/Hip Exercises: Prone   Hamstring Curl  10 reps      Moist Heat Therapy   Number Minutes Moist Heat  15 Minutes    Moist Heat Location  Cervical;Knee      Manual Therapy   Manual therapy comments  taught patient how to do  at home.  tennis ball issued.     Soft tissue mobilization  quads and hamstrings,  TFL sore,      instrunent, foam roller manual   also IT band   Passive ROM  flexion /  extension knee             PT Education - 04/04/18 1612    Education Details  anatomy,  exercise form.  self care    Person(s) Educated  Patient    Methods  Explanation;Demonstration    Comprehension  Returned demonstration;Verbalized understanding       PT Short Term Goals - 03/31/18 1720      PT SHORT TERM GOAL #1   Title  She will be independent with initial HEP     Status  Achieved      PT SHORT TERM GOAL #2   Title  Neck pain decr 30% or more and ROM at least 50% normal    Baseline  improved but goal not met    Status  On-going      PT SHORT TERM GOAL #3   Title  Thigh and knee pain decr 30% or more and knee ROM active 130 degree or more    Baseline  improved but not 30%    Status  On-going      PT SHORT TERM GOAL #4   Title  She will demo understanding of good posture.     Baseline  she was able to to demo but reports pain    Status  Achieved        PT Long Term Goals - 03/07/18 1331      PT LONG TERM GOAL #1   Title  she will be indpeendnet with all HEp issued    Time  6    Period  Weeks    Status  New      PT  LONG TERM GOAL #2   Title  Pain will become intermittant and decr 60% or more    Time  6    Period  Weeks      PT LONG TERM GOAL #3   Title  She will have full neck ROm with 1-2 max pain.    Time  6    Period  Weeks    Status  New      PT LONG TERM GOAL #4   Title  she will report able to stand and walk for normal home and community distances.     Time  6    Period  Weeks    Status  New      PT LONG TERM GOAL #5   Title  She will report able to sit with good posture with 1-2 max pain for 60 min for work activity    Time  6    Period  Weeks    Status  New            Plan - 04/04/18 1613    Clinical Impression Statement  Manual helpful with knee pain.  TFL highly tender and responded well. Neck pain improved 75 % however she demonstrates slow guarded movements. Patient has one more appointment scheduled,  She will need to make more.     PT Next Visit Plan   , Modalities and manual for pain and ROM, strength.  SLR   Extend after next week  work toward goals.      PT Home Exercise Plan  KtoC, LTR, cervical Rot/ side bend,  good posture.  QS, SAQ, heel slides, calf and hamstring stretch    Consulted and Agree with Plan of Care  Patient       Patient will benefit from skilled therapeutic intervention in order to improve the following deficits and impairments:     Visit Diagnosis: Cervicalgia  Acute pain of right knee  Other muscle spasm     Problem List Patient Active Problem List   Diagnosis Date Noted  . Right knee pain 02/23/2018  . Neck pain 02/23/2018    Terrion Gencarelli  PTA 04/04/2018, 4:23 PM  [facility] [address] [place], [place], [address] Phone: [phone]   Fax:  [phone]  Name: Angela Mann MRN: [id number] Date of Birth: 07-Dec-1985 PHYSICAL THERAPY DISCHARGE SUMMARY  Visits from Start of Care: 8  Current functional level related to goals / functional outcomes: See above . She called  and reported OK to Discharge  Remaining deficits: See above   Education / Equipment: HEP Plan: Patient agrees to discharge.  Patient goals were partially met. Patient is being discharged due to the patient's request.  ?????   Pearson Forster, PT  05/09/18

## 2018-04-07 ENCOUNTER — Ambulatory Visit: Payer: BLUE CROSS/BLUE SHIELD

## 2018-07-19 ENCOUNTER — Telehealth: Payer: Self-pay | Admitting: Family Medicine

## 2018-07-19 NOTE — Telephone Encounter (Signed)
Dismissal letter in guarantor snapshot  °

## 2018-12-02 DIAGNOSIS — Z01419 Encounter for gynecological examination (general) (routine) without abnormal findings: Secondary | ICD-10-CM | POA: Diagnosis not present

## 2018-12-02 DIAGNOSIS — Z6830 Body mass index (BMI) 30.0-30.9, adult: Secondary | ICD-10-CM | POA: Diagnosis not present

## 2018-12-02 DIAGNOSIS — Z3040 Encounter for surveillance of contraceptives, unspecified: Secondary | ICD-10-CM | POA: Diagnosis not present

## 2018-12-02 DIAGNOSIS — Z30432 Encounter for removal of intrauterine contraceptive device: Secondary | ICD-10-CM | POA: Diagnosis not present

## 2020-06-28 ENCOUNTER — Ambulatory Visit: Payer: Managed Care, Other (non HMO)

## 2020-06-28 DIAGNOSIS — Z23 Encounter for immunization: Secondary | ICD-10-CM | POA: Diagnosis not present

## 2021-02-25 DIAGNOSIS — Z6832 Body mass index (BMI) 32.0-32.9, adult: Secondary | ICD-10-CM | POA: Diagnosis not present

## 2021-02-25 DIAGNOSIS — Z01419 Encounter for gynecological examination (general) (routine) without abnormal findings: Secondary | ICD-10-CM | POA: Diagnosis not present

## 2021-02-25 DIAGNOSIS — Z30432 Encounter for removal of intrauterine contraceptive device: Secondary | ICD-10-CM | POA: Diagnosis not present

## 2021-04-15 ENCOUNTER — Other Ambulatory Visit: Payer: Self-pay | Admitting: Internal Medicine

## 2021-04-15 ENCOUNTER — Ambulatory Visit
Admission: RE | Admit: 2021-04-15 | Discharge: 2021-04-15 | Disposition: A | Discharge status: Home / Self Care | Payer: Managed Care, Other (non HMO) | Source: Ambulatory Visit | Attending: Internal Medicine | Admitting: Internal Medicine

## 2021-04-15 DIAGNOSIS — R1084 Generalized abdominal pain: Secondary | ICD-10-CM

## 2021-04-15 DIAGNOSIS — K5909 Other constipation: Secondary | ICD-10-CM | POA: Diagnosis not present

## 2021-04-18 DIAGNOSIS — K5909 Other constipation: Secondary | ICD-10-CM | POA: Diagnosis not present

## 2021-04-18 DIAGNOSIS — N926 Irregular menstruation, unspecified: Secondary | ICD-10-CM | POA: Diagnosis not present

## 2021-04-18 DIAGNOSIS — Z3201 Encounter for pregnancy test, result positive: Secondary | ICD-10-CM | POA: Diagnosis not present

## 2021-04-29 DIAGNOSIS — R300 Dysuria: Secondary | ICD-10-CM | POA: Diagnosis not present

## 2021-05-06 ENCOUNTER — Other Ambulatory Visit: Payer: Self-pay

## 2021-05-06 ENCOUNTER — Inpatient Hospital Stay (HOSPITAL_COMMUNITY)
Admission: AD | Admit: 2021-05-06 | Discharge: 2021-05-06 | Disposition: A | Discharge status: Home / Self Care | Payer: BC Managed Care – PPO | Attending: Obstetrics and Gynecology | Admitting: Obstetrics and Gynecology

## 2021-05-06 ENCOUNTER — Encounter (HOSPITAL_COMMUNITY): Payer: Self-pay | Admitting: *Deleted

## 2021-05-06 DIAGNOSIS — Z885 Allergy status to narcotic agent status: Secondary | ICD-10-CM | POA: Diagnosis not present

## 2021-05-06 DIAGNOSIS — Z87891 Personal history of nicotine dependence: Secondary | ICD-10-CM | POA: Diagnosis not present

## 2021-05-06 DIAGNOSIS — Z3A01 Less than 8 weeks gestation of pregnancy: Secondary | ICD-10-CM | POA: Diagnosis not present

## 2021-05-06 DIAGNOSIS — O00101 Right tubal pregnancy without intrauterine pregnancy: Secondary | ICD-10-CM | POA: Diagnosis not present

## 2021-05-06 DIAGNOSIS — N911 Secondary amenorrhea: Secondary | ICD-10-CM | POA: Diagnosis not present

## 2021-05-06 LAB — URINALYSIS, ROUTINE W REFLEX MICROSCOPIC
Bilirubin Urine: NEGATIVE
Glucose, UA: NEGATIVE mg/dL
Hgb urine dipstick: NEGATIVE
Ketones, ur: NEGATIVE mg/dL
Leukocytes,Ua: NEGATIVE
Nitrite: NEGATIVE
Protein, ur: NEGATIVE mg/dL
Specific Gravity, Urine: 1.015 (ref 1.005–1.030)
pH: 6 (ref 5.0–8.0)

## 2021-05-06 LAB — CBC
HCT: 33.8 % — ABNORMAL LOW (ref 36.0–46.0)
Hemoglobin: 11.5 g/dL — ABNORMAL LOW (ref 12.0–15.0)
MCH: 29.5 pg (ref 26.0–34.0)
MCHC: 34 g/dL (ref 30.0–36.0)
MCV: 86.7 fL (ref 80.0–100.0)
Platelets: 347 10*3/uL (ref 150–400)
RBC: 3.9 MIL/uL (ref 3.87–5.11)
RDW: 12.8 % (ref 11.5–15.5)
WBC: 9.6 10*3/uL (ref 4.0–10.5)
nRBC: 0 % (ref 0.0–0.2)

## 2021-05-06 LAB — COMPREHENSIVE METABOLIC PANEL
ALT: 10 U/L (ref 0–44)
AST: 16 U/L (ref 15–41)
Albumin: 3.3 g/dL — ABNORMAL LOW (ref 3.5–5.0)
Alkaline Phosphatase: 61 U/L (ref 38–126)
Anion gap: 8 (ref 5–15)
BUN: 8 mg/dL (ref 6–20)
CO2: 26 mmol/L (ref 22–32)
Calcium: 9.1 mg/dL (ref 8.9–10.3)
Chloride: 105 mmol/L (ref 98–111)
Creatinine, Ser: 1.02 mg/dL — ABNORMAL HIGH (ref 0.44–1.00)
GFR, Estimated: >60 mL/min (ref >60)
Glucose, Bld: 105 mg/dL — ABNORMAL HIGH (ref 70–99)
Potassium: 3.7 mmol/L (ref 3.5–5.1)
Sodium: 139 mmol/L (ref 135–145)
Total Bilirubin: 0.5 mg/dL (ref 0.3–1.2)
Total Protein: 6.5 g/dL (ref 6.5–8.1)

## 2021-05-06 LAB — HCG, QUANTITATIVE, PREGNANCY: hCG, Beta Chain, Quant, S: 2807 m[IU]/mL — ABNORMAL HIGH (ref <5)

## 2021-05-06 LAB — POCT PREGNANCY, URINE: Preg Test, Ur: POSITIVE — AB

## 2021-05-06 MED ORDER — METHOTREXATE FOR ECTOPIC PREGNANCY
50.0000 mg/m2 | Freq: Once | INTRAMUSCULAR | Status: AC
  Administered 2021-05-06: 98 mg via INTRAMUSCULAR
  Filled 2021-05-06: qty 3.92

## 2021-05-06 NOTE — MAU Provider Note (Signed)
History     CSN: [id number]  Arrival date and time: 05/06/21 1708   Event Date/Time   First Provider Initiated Contact with Patient 05/06/21 1744      Chief Complaint  Patient presents with   Ectopic Pregnancy   HPI 34 you G3P2 at 5 5/7 wks from LMP. C/O "gas pains" a few days ago. Now vague pelvic abdominal ache. No N/V, no dizziness. U/S in our office today notes empty uterus with thin EM stripe and a right adnexal mass with a sac and blood flow, no pole, no heart beat, no FF.  Pertinent Gynecological History: Menses:  no vaginal bleeding Bleeding: none Contraception:  none DES exposure: denies Blood transfusions: none Sexually transmitted diseases: past history: trichomonas in 2013 Previous GYN Procedures:  CKC, LEEP   Last mammogram:  N/A  Date:  Last pap:     Date:   Past Medical History:  Diagnosis Date   Abnormal Pap smear    h/o LEEP   HSV (herpes simplex virus) anogenital infection    Late prenatal care    17 weeks   Trichomonas    2013    Past Surgical History:  Procedure Laterality Date   cold knife conization     from abnormal pap   LEEP      Family History  Problem Relation Age of Onset   Hypertension Mother    Kidney disease Mother        had transplant in 2013   Thyroid disease Mother    Diabetes Mother    Heart attack Father        x2   Hypertension Father    Kidney disease Father        dialysis   Cancer Maternal Grandmother        breast   Thyroid disease Maternal Grandmother    Hypertension Brother     Social History   Tobacco Use   Smoking status: Former    Types: Cigarettes    Quit date: 02/16/2013    Years since quitting: 8.2   Smokeless tobacco: Never  Vaping Use   Vaping Use: Never used  Substance Use Topics   Alcohol use: No   Drug use: No    Allergies:  Allergies  Allergen Reactions   Vicodin [Hydrocodone-Acetaminophen]     Nausea and severe headache    Medications Prior to Admission  Medication Sig  Dispense Refill Last Dose   amoxicillin-clavulanate (AUGMENTIN) 875-125 MG tablet Take 1 tablet by mouth 2 (two) times daily. (Patient not taking: Reported on 02/23/2018) 20 tablet 0    cyclobenzaprine (FLEXERIL) 10 MG tablet Take 1 tablet (10 mg total) by mouth at bedtime as needed. 15 tablet 0    fluticasone (FLONASE) 50 MCG/ACT nasal spray SPRAY 2 SPRAYS INTO EACH NOSTRIL EVERY DAY 48 g 0    ibuprofen (ADVIL,MOTRIN) 800 MG tablet Take 1 tablet (800 mg total) by mouth every 8 (eight) hours as needed. 21 tablet 0    levonorgestrel (MIRENA) 20 MCG/24HR IUD 1 each by Intrauterine route once.      ondansetron (ZOFRAN) 4 MG tablet Take 1 tablet (4 mg total) by mouth every 8 (eight) hours as needed for nausea or vomiting. (Patient not taking: Reported on 08/12/2017) 10 tablet 0    Prenatal Vit-Fe Fumarate-FA (PRENATAL MULTIVITAMIN) TABS tablet Take 1 tablet by mouth daily at 12 noon.      traMADol (ULTRAM) 50 MG tablet Take 1 tablet (50 mg total) by mouth every 6 (  six) hours as needed for severe pain. 15 tablet 0     Review of Systems Physical Exam   Blood pressure 117/77, pulse 79, temperature 98.2 F (36.8 C), temperature source Oral, resp. rate 20, height 5\' 5"  (1.651 m), weight 83.5 kg, last menstrual period 03/27/2021, SpO2 99 %, currently breastfeeding.  Physical Exam Abdomen soft, no rebound tenderness MAU Course  Procedures  MDM   Assessment and Plan  Right ectopic pregnancy. D/W patient ectopic and management options including surgery or methotrexate. She opts for MTX. D/W medication, side effects, importance of scheduled follow up to assess response to medication, possible need for surgery at some point and risk of recurrent ectopic with future pregnancies. Recommend no travel and ectopic precautions given. Will check baseline labs and MTX ordered. She has appointment for [facility] at our office in 3 days. She states she understands and agrees.  [facility] 05/06/2021, 6:55 PM

## 2021-05-06 NOTE — MAU Note (Signed)
MTX given. Questions answered regarding MTX and follow up at office on Friday. S/S to return to hospital reviewed and voices understanding.

## 2021-05-06 NOTE — Progress Notes (Signed)
Written and verbal d/c instructions given and understanding voiced. 

## 2021-05-06 NOTE — MAU Note (Signed)
Presents stating she was sent from MD office secondary ectopic pregnancy.  States had vaginal ultrasound today in office and no pregnancy was noted in the uterus.  Denies current VB, but endorses generalized abdominal pain.  LMP 03/27/2021.

## 2021-05-09 DIAGNOSIS — O009 Unspecified ectopic pregnancy without intrauterine pregnancy: Secondary | ICD-10-CM | POA: Diagnosis not present

## 2021-05-15 DIAGNOSIS — O009 Unspecified ectopic pregnancy without intrauterine pregnancy: Secondary | ICD-10-CM | POA: Diagnosis not present

## 2021-05-22 DIAGNOSIS — O009 Unspecified ectopic pregnancy without intrauterine pregnancy: Secondary | ICD-10-CM | POA: Diagnosis not present

## 2021-05-27 DIAGNOSIS — O009 Unspecified ectopic pregnancy without intrauterine pregnancy: Secondary | ICD-10-CM | POA: Diagnosis not present

## 2021-06-18 DIAGNOSIS — O009 Unspecified ectopic pregnancy without intrauterine pregnancy: Secondary | ICD-10-CM | POA: Diagnosis not present

## 2021-09-19 DIAGNOSIS — Z111 Encounter for screening for respiratory tuberculosis: Secondary | ICD-10-CM | POA: Diagnosis not present

## 2021-10-25 IMAGING — CR DG ABDOMEN ACUTE W/ 1V CHEST
1 series · 1 of 1 positions shown · non-contrast
Comparison: None.

CLINICAL DATA: Right upper and left lower quadrant pain for 1 day

EXAM:
DG ABDOMEN ACUTE WITH 1 VIEW CHEST

[w chest pa]
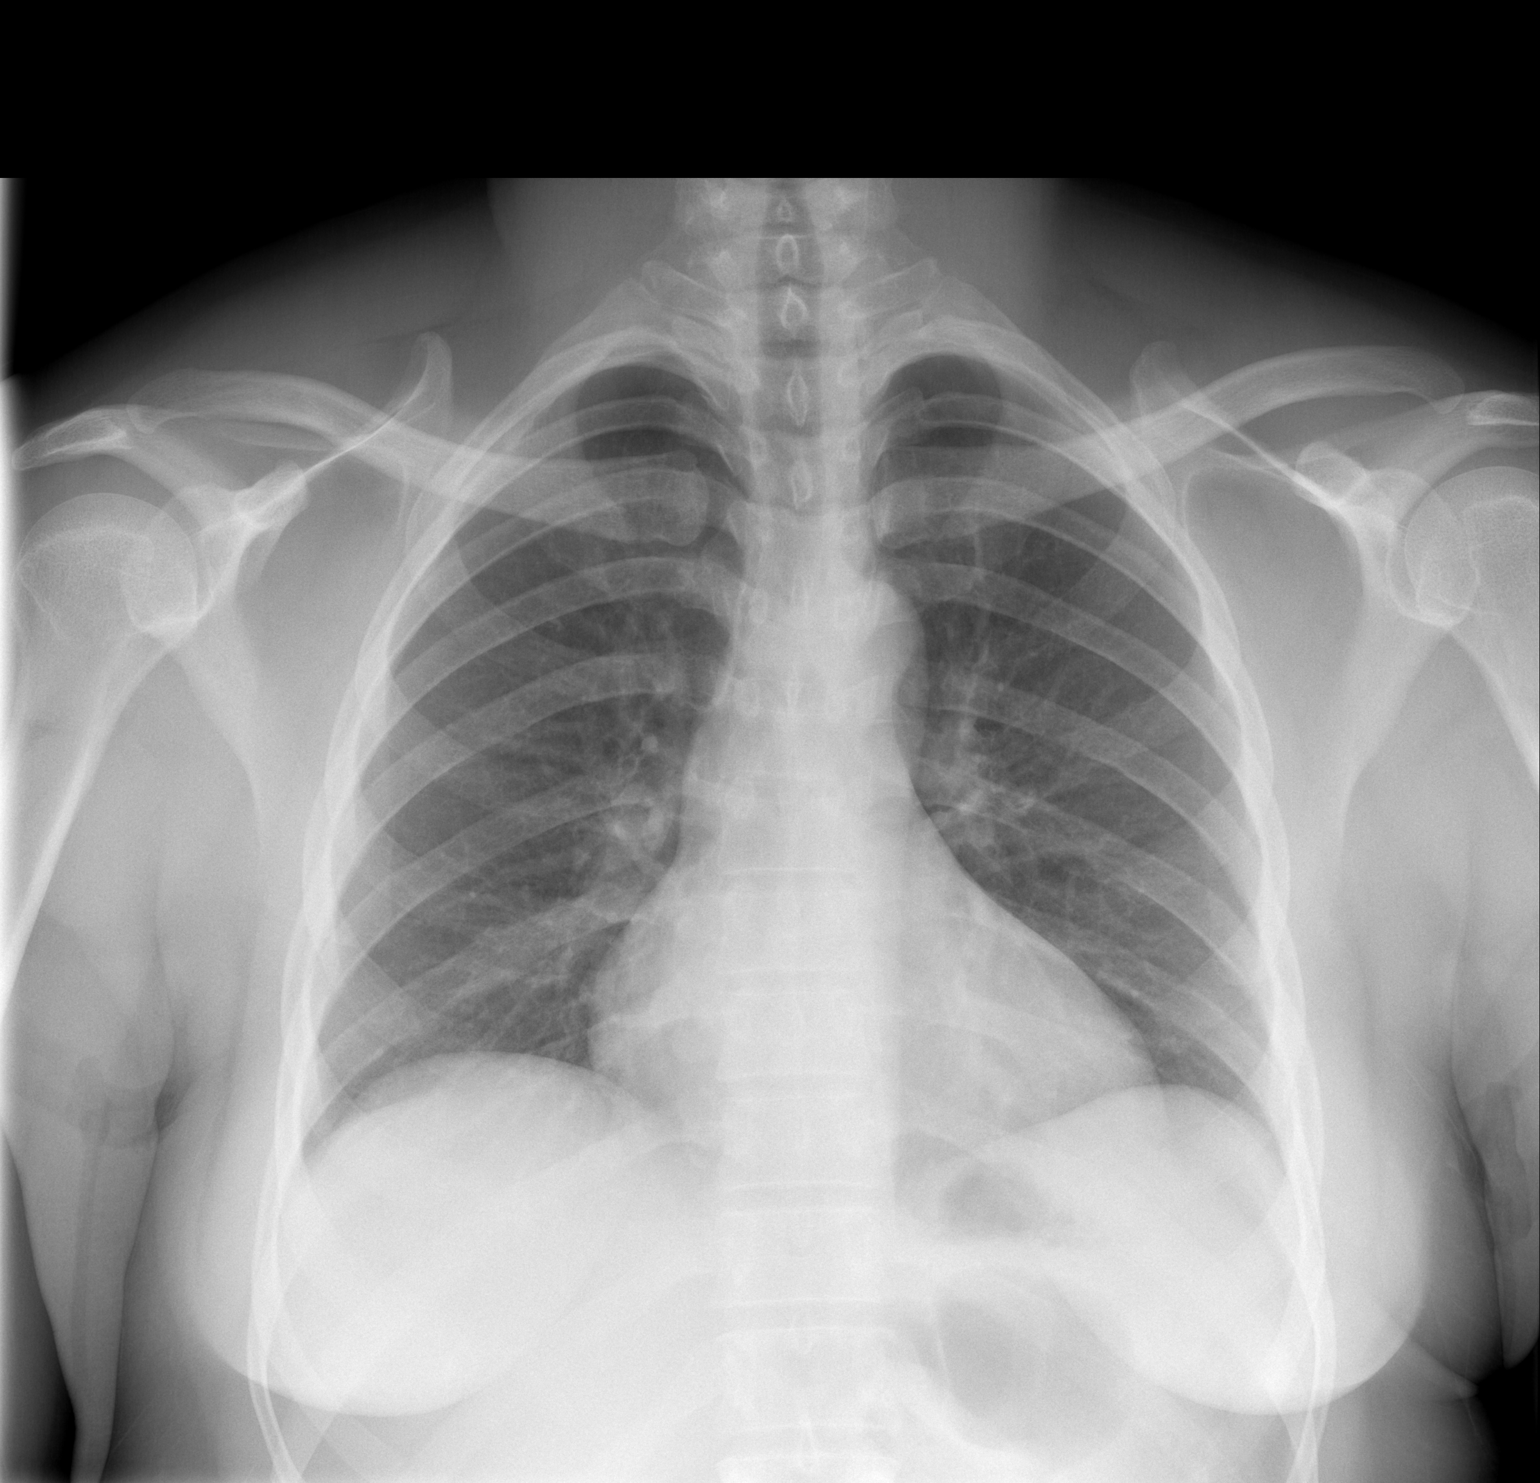

[1 of 1 positions shown; findings below may reference images not displayed]

FINDINGS: There is a nonobstructive bowel gas pattern. There is a moderate
colonic stool burden. There is no gross organomegaly or abnormal
soft tissue calcification.

The bones are unremarkable.  The lung bases are clear.
IMPRESSION: Moderate colonic stool burden without evidence of mechanical
obstruction.

## 2022-01-26 DIAGNOSIS — N911 Secondary amenorrhea: Secondary | ICD-10-CM | POA: Diagnosis not present

## 2022-02-06 DIAGNOSIS — Z3685 Encounter for antenatal screening for Streptococcus B: Secondary | ICD-10-CM | POA: Diagnosis not present

## 2022-02-06 DIAGNOSIS — O09522 Supervision of elderly multigravida, second trimester: Secondary | ICD-10-CM | POA: Diagnosis not present

## 2022-02-06 DIAGNOSIS — Z3481 Encounter for supervision of other normal pregnancy, first trimester: Secondary | ICD-10-CM | POA: Diagnosis not present

## 2022-02-06 LAB — HEPATITIS C ANTIBODY: HCV Ab: NEGATIVE

## 2022-02-06 LAB — OB RESULTS CONSOLE ABO/RH: RH Type: POSITIVE

## 2022-02-06 LAB — OB RESULTS CONSOLE ANTIBODY SCREEN: Antibody Screen: NEGATIVE

## 2022-02-06 LAB — OB RESULTS CONSOLE RPR: RPR: NONREACTIVE

## 2022-02-06 LAB — OB RESULTS CONSOLE HEPATITIS B SURFACE ANTIGEN: Hepatitis B Surface Ag: NEGATIVE

## 2022-02-06 LAB — OB RESULTS CONSOLE HIV ANTIBODY (ROUTINE TESTING): HIV: NONREACTIVE

## 2022-02-06 LAB — OB RESULTS CONSOLE RUBELLA ANTIBODY, IGM: Rubella: IMMUNE

## 2022-02-12 DIAGNOSIS — Z3682 Encounter for antenatal screening for nuchal translucency: Secondary | ICD-10-CM | POA: Diagnosis not present

## 2022-02-12 DIAGNOSIS — Z3A13 13 weeks gestation of pregnancy: Secondary | ICD-10-CM | POA: Diagnosis not present

## 2022-02-13 DIAGNOSIS — Z113 Encounter for screening for infections with a predominantly sexual mode of transmission: Secondary | ICD-10-CM | POA: Diagnosis not present

## 2022-02-13 DIAGNOSIS — Z3400 Encounter for supervision of normal first pregnancy, unspecified trimester: Secondary | ICD-10-CM | POA: Diagnosis not present

## 2022-02-13 DIAGNOSIS — Z3A13 13 weeks gestation of pregnancy: Secondary | ICD-10-CM | POA: Diagnosis not present

## 2022-02-13 DIAGNOSIS — Z124 Encounter for screening for malignant neoplasm of cervix: Secondary | ICD-10-CM | POA: Diagnosis not present

## 2022-02-13 LAB — OB RESULTS CONSOLE GC/CHLAMYDIA
Chlamydia: NEGATIVE
Neisseria Gonorrhea: NEGATIVE

## 2022-04-13 DIAGNOSIS — Z3A21 21 weeks gestation of pregnancy: Secondary | ICD-10-CM | POA: Diagnosis not present

## 2022-04-13 DIAGNOSIS — Z3482 Encounter for supervision of other normal pregnancy, second trimester: Secondary | ICD-10-CM | POA: Diagnosis not present

## 2022-04-13 DIAGNOSIS — Z363 Encounter for antenatal screening for malformations: Secondary | ICD-10-CM | POA: Diagnosis not present

## 2022-04-13 DIAGNOSIS — Z3400 Encounter for supervision of normal first pregnancy, unspecified trimester: Secondary | ICD-10-CM | POA: Diagnosis not present

## 2022-05-20 DIAGNOSIS — T50A25A Adverse effect of mixed bacterial vaccines without a pertussis component, initial encounter: Secondary | ICD-10-CM | POA: Diagnosis not present

## 2022-05-20 DIAGNOSIS — Z362 Encounter for other antenatal screening follow-up: Secondary | ICD-10-CM | POA: Diagnosis not present

## 2022-05-20 DIAGNOSIS — Z23 Encounter for immunization: Secondary | ICD-10-CM | POA: Diagnosis not present

## 2022-05-20 DIAGNOSIS — Z3A27 27 weeks gestation of pregnancy: Secondary | ICD-10-CM | POA: Diagnosis not present

## 2022-07-23 DIAGNOSIS — Z369 Encounter for antenatal screening, unspecified: Secondary | ICD-10-CM | POA: Diagnosis not present

## 2022-07-23 LAB — OB RESULTS CONSOLE GBS: GBS: NEGATIVE

## 2022-08-11 ENCOUNTER — Telehealth (HOSPITAL_COMMUNITY): Payer: Self-pay | Admitting: *Deleted

## 2022-08-11 ENCOUNTER — Encounter (HOSPITAL_COMMUNITY): Payer: Self-pay | Admitting: *Deleted

## 2022-08-11 NOTE — Telephone Encounter (Signed)
Preadmission screen  

## 2022-08-17 NOTE — L&D Delivery Note (Signed)
Delivery Note At 5:24 PM a viable female was delivered via  (Presentation: LOA).  APGAR: 8, 9; weight pending.   Placenta status:  S, I. 3V Cord: marginal insertion with the following complications: none.  Cord pH: n/a  Anesthesia: Epidural Episiotomy: n/a  Lacerations:  n/a Suture Repair:  n/a Est. Blood Loss (mL):  105  Mom to postpartum.  Baby to Couplet care / Skin to Skin.  Linda Hedges 08/18/2022, 5:37 PM

## 2022-08-18 ENCOUNTER — Inpatient Hospital Stay (HOSPITAL_COMMUNITY): Payer: Medicaid Other | Admitting: Anesthesiology

## 2022-08-18 ENCOUNTER — Other Ambulatory Visit: Payer: Self-pay

## 2022-08-18 ENCOUNTER — Encounter (HOSPITAL_COMMUNITY): Payer: Self-pay | Admitting: Obstetrics & Gynecology

## 2022-08-18 ENCOUNTER — Inpatient Hospital Stay (HOSPITAL_COMMUNITY)
Admission: AD | Admit: 2022-08-18 | Discharge: 2022-08-20 | DRG: 806 | Disposition: A | Discharge status: Home / Self Care | Payer: Medicaid Other | Attending: Obstetrics & Gynecology | Admitting: Obstetrics & Gynecology

## 2022-08-18 DIAGNOSIS — Z87891 Personal history of nicotine dependence: Secondary | ICD-10-CM

## 2022-08-18 DIAGNOSIS — O411290 Chorioamnionitis, unspecified trimester, not applicable or unspecified: Secondary | ICD-10-CM | POA: Diagnosis not present

## 2022-08-18 DIAGNOSIS — A6000 Herpesviral infection of urogenital system, unspecified: Secondary | ICD-10-CM | POA: Diagnosis not present

## 2022-08-18 DIAGNOSIS — Z3A39 39 weeks gestation of pregnancy: Secondary | ICD-10-CM | POA: Diagnosis not present

## 2022-08-18 DIAGNOSIS — O9832 Other infections with a predominantly sexual mode of transmission complicating childbirth: Secondary | ICD-10-CM | POA: Diagnosis not present

## 2022-08-18 DIAGNOSIS — O43123 Velamentous insertion of umbilical cord, third trimester: Secondary | ICD-10-CM | POA: Diagnosis present

## 2022-08-18 DIAGNOSIS — O26893 Other specified pregnancy related conditions, third trimester: Secondary | ICD-10-CM | POA: Diagnosis not present

## 2022-08-18 DIAGNOSIS — O09513 Supervision of elderly primigravida, third trimester: Secondary | ICD-10-CM | POA: Diagnosis not present

## 2022-08-18 DIAGNOSIS — Z3A00 Weeks of gestation of pregnancy not specified: Secondary | ICD-10-CM | POA: Diagnosis not present

## 2022-08-18 DIAGNOSIS — Z3490 Encounter for supervision of normal pregnancy, unspecified, unspecified trimester: Principal | ICD-10-CM

## 2022-08-18 LAB — CBC
HCT: 42.1 % (ref 36.0–46.0)
Hemoglobin: 15 g/dL (ref 12.0–15.0)
MCH: 29.5 pg (ref 26.0–34.0)
MCHC: 35.6 g/dL (ref 30.0–36.0)
MCV: 82.7 fL (ref 80.0–100.0)
Platelets: 265 10*3/uL (ref 150–400)
RBC: 5.09 MIL/uL (ref 3.87–5.11)
RDW: 14.9 % (ref 11.5–15.5)
WBC: 13.3 10*3/uL — ABNORMAL HIGH (ref 4.0–10.5)
nRBC: 0 % (ref 0.0–0.2)

## 2022-08-18 LAB — TYPE AND SCREEN
ABO/RH(D): O POS
Antibody Screen: NEGATIVE

## 2022-08-18 MED ORDER — ZOLPIDEM TARTRATE 5 MG PO TABS: 5.0000 mg | ORAL_TABLET | Freq: Every evening | ORAL | Status: DC | PRN

## 2022-08-18 MED ORDER — BENZOCAINE-MENTHOL 20-0.5 % EX AERO: 1.0000 | INHALATION_SPRAY | CUTANEOUS | Status: DC | PRN

## 2022-08-18 MED ORDER — OXYTOCIN-SODIUM CHLORIDE 30-0.9 UT/500ML-% IV SOLN
2.5000 [IU]/h | INTRAVENOUS | Status: DC
  Administered 2022-08-18: 2.5 [IU]/h via INTRAVENOUS
  Filled 2022-08-18: qty 500

## 2022-08-18 MED ORDER — DIBUCAINE (PERIANAL) 1 % EX OINT: 1.0000 | TOPICAL_OINTMENT | CUTANEOUS | Status: DC | PRN

## 2022-08-18 MED ORDER — FENTANYL-BUPIVACAINE-NACL 0.5-0.125-0.9 MG/250ML-% EP SOLN
12.0000 mL/h | EPIDURAL | Status: DC | PRN
  Filled 2022-08-18: qty 250

## 2022-08-18 MED ORDER — FENTANYL CITRATE (PF) 100 MCG/2ML IJ SOLN
INTRAMUSCULAR | Status: AC
  Administered 2022-08-18: 100 ug
  Filled 2022-08-18: qty 2

## 2022-08-18 MED ORDER — LACTATED RINGERS IV SOLN: 500.0000 mL | INTRAVENOUS | Status: DC | PRN

## 2022-08-18 MED ORDER — DIPHENHYDRAMINE HCL 50 MG/ML IJ SOLN: 12.5000 mg | INTRAMUSCULAR | Status: DC | PRN

## 2022-08-18 MED ORDER — EPHEDRINE 5 MG/ML INJ: 10.0000 mg | INTRAVENOUS | Status: DC | PRN

## 2022-08-18 MED ORDER — ONDANSETRON HCL 4 MG/2ML IJ SOLN: 4.0000 mg | INTRAMUSCULAR | Status: DC | PRN

## 2022-08-18 MED ORDER — FENTANYL CITRATE (PF) 100 MCG/2ML IJ SOLN: 100.0000 ug | Freq: Once | INTRAMUSCULAR | Status: DC

## 2022-08-18 MED ORDER — OXYTOCIN BOLUS FROM INFUSION
333.0000 mL | Freq: Once | INTRAVENOUS | Status: AC
  Administered 2022-08-18: 333 mL via INTRAVENOUS

## 2022-08-18 MED ORDER — SENNOSIDES-DOCUSATE SODIUM 8.6-50 MG PO TABS
2.0000 | ORAL_TABLET | Freq: Every day | ORAL | Status: DC
  Administered 2022-08-19 – 2022-08-20 (×2): 2 via ORAL
  Filled 2022-08-18 (×2): qty 2

## 2022-08-18 MED ORDER — DIPHENHYDRAMINE HCL 25 MG PO CAPS: 25.0000 mg | ORAL_CAPSULE | Freq: Four times a day (QID) | ORAL | Status: DC | PRN

## 2022-08-18 MED ORDER — PHENYLEPHRINE 80 MCG/ML (10ML) SYRINGE FOR IV PUSH (FOR BLOOD PRESSURE SUPPORT): 80.0000 ug | PREFILLED_SYRINGE | INTRAVENOUS | Status: DC | PRN

## 2022-08-18 MED ORDER — LIDOCAINE HCL (PF) 1 % IJ SOLN: 30.0000 mL | INTRAMUSCULAR | Status: DC | PRN

## 2022-08-18 MED ORDER — ACETAMINOPHEN 325 MG PO TABS: 650.0000 mg | ORAL_TABLET | ORAL | Status: DC | PRN

## 2022-08-18 MED ORDER — LIDOCAINE HCL (PF) 1 % IJ SOLN
INTRAMUSCULAR | Status: DC | PRN
  Administered 2022-08-18: 2 mL via EPIDURAL
  Administered 2022-08-18: 10 mL via EPIDURAL

## 2022-08-18 MED ORDER — WITCH HAZEL-GLYCERIN EX PADS: 1.0000 | MEDICATED_PAD | CUTANEOUS | Status: DC | PRN

## 2022-08-18 MED ORDER — ACETAMINOPHEN 325 MG PO TABS
650.0000 mg | ORAL_TABLET | ORAL | Status: DC | PRN
  Administered 2022-08-19 – 2022-08-20 (×3): 650 mg via ORAL
  Filled 2022-08-18 (×3): qty 2

## 2022-08-18 MED ORDER — PRENATAL MULTIVITAMIN CH
1.0000 | ORAL_TABLET | Freq: Every day | ORAL | Status: DC
  Administered 2022-08-19 – 2022-08-20 (×2): 1 via ORAL
  Filled 2022-08-18 (×2): qty 1

## 2022-08-18 MED ORDER — OXYCODONE HCL 5 MG PO TABS
5.0000 mg | ORAL_TABLET | ORAL | Status: DC | PRN
  Administered 2022-08-18 – 2022-08-20 (×4): 5 mg via ORAL
  Filled 2022-08-18 (×4): qty 1

## 2022-08-18 MED ORDER — FENTANYL-BUPIVACAINE-NACL 0.5-0.125-0.9 MG/250ML-% EP SOLN
EPIDURAL | Status: DC | PRN
  Administered 2022-08-18: 12 mL/h via EPIDURAL

## 2022-08-18 MED ORDER — TETANUS-DIPHTH-ACELL PERTUSSIS 5-2.5-18.5 LF-MCG/0.5 IM SUSY: 0.5000 mL | PREFILLED_SYRINGE | Freq: Once | INTRAMUSCULAR | Status: DC

## 2022-08-18 MED ORDER — ONDANSETRON HCL 4 MG PO TABS: 4.0000 mg | ORAL_TABLET | ORAL | Status: DC | PRN

## 2022-08-18 MED ORDER — FLEET ENEMA 7-19 GM/118ML RE ENEM: 1.0000 | ENEMA | RECTAL | Status: DC | PRN

## 2022-08-18 MED ORDER — SOD CITRATE-CITRIC ACID 500-334 MG/5ML PO SOLN: 30.0000 mL | ORAL | Status: DC | PRN

## 2022-08-18 MED ORDER — IBUPROFEN 600 MG PO TABS
600.0000 mg | ORAL_TABLET | Freq: Four times a day (QID) | ORAL | Status: DC
  Administered 2022-08-18 – 2022-08-20 (×7): 600 mg via ORAL
  Filled 2022-08-18 (×7): qty 1

## 2022-08-18 MED ORDER — SIMETHICONE 80 MG PO CHEW: 80.0000 mg | CHEWABLE_TABLET | ORAL | Status: DC | PRN

## 2022-08-18 MED ORDER — LACTATED RINGERS IV SOLN: INTRAVENOUS | Status: DC

## 2022-08-18 MED ORDER — ONDANSETRON HCL 4 MG/2ML IJ SOLN: 4.0000 mg | Freq: Four times a day (QID) | INTRAMUSCULAR | Status: DC | PRN

## 2022-08-18 MED ORDER — LACTATED RINGERS IV SOLN
500.0000 mL | Freq: Once | INTRAVENOUS | Status: AC
  Administered 2022-08-18: 500 mL via INTRAVENOUS

## 2022-08-18 MED ORDER — COCONUT OIL OIL: 1.0000 | TOPICAL_OIL | Status: DC | PRN

## 2022-08-18 NOTE — Lactation Note (Signed)
This note was copied from a baby's chart. Lactation Consultation Note  Patient Name: Angela Mann [id number] Date: 08/18/2022 Reason for consult: Initial assessment;Term Age:37 hours, term female infant being breast and formula fed this is Data processing manager preference. LC did not observe latch, Per Birth Parent infant was BF for 12 minutes and afterwards supplemented with 10 mls of formula at 2100 pm. Birth Parent requested DEBP she desires to latch and supplement infant with formula and then use DEBP. Birth Parent is experienced with breastfeeding see maternal data below. Per Birth Parent,  she did this method of feeding with her other children. Birth Parent will continue to BF infant according to hunger cues, on demand,STS, afterwards supplement with formula  each feeding and then pump. Birth Parent was using the DEBP when [place] left the room and plans to continue to pump every 3 hours for 15 minutes on initial setting. LC gave Breastfeeding supplemental sheet based on infant's age and hours of life. Birth Parent knows EBM is safe at room temperature for 4 hours whereas formula must be used within 1 hour. LC discussed importance of maternal diet, rest and hydration with Birth Parent. Mom made aware of O/P services, breastfeeding support groups, community resources, and our phone # for post-discharge questions.    Maternal Data Has patient been taught Hand Expression?: Yes Does the patient have breastfeeding experience prior to this delivery?: Yes How long did the patient breastfeed?: Birth Parent, BF 1st child for 4 months and 2nd child for 3 years  Feeding Mother's Current Feeding Choice: Breast Milk and Formula  LATCH Score                    Lactation Tools Discussed/Used Tools: Pump Breast pump type: Double-Electric Breast Pump Pump Education: Setup, frequency, and cleaning;Milk Storage Reason for Pumping: Birth Parent requested DEBP Pumping frequency: Birth Parent plans to pump every  3 hours for 15 minutes on inital setting.  Interventions Interventions: Breast feeding basics reviewed;Position options;Skin to skin;Education;DEBP  Discharge Pump: Advised to call insurance company  Consult Status Consult Status: Follow-up Date: 08/19/22 Follow-up type: In-patient    Eulis Canner 08/18/2022, 10:16 PM

## 2022-08-18 NOTE — MAU Note (Signed)
Pt arrived to MAU in active labor. Pt is 7cm. FHT obtained Expedited transfer to l&d. Provider at bedside for transport.

## 2022-08-18 NOTE — Progress Notes (Signed)
Comfortable with CLEA SVE 7/90/-1, AROM with moderate meconium stained fluid  Linda Hedges, DO
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
## 2022-08-18 NOTE — Anesthesia Preprocedure Evaluation (Signed)
Anesthesia Evaluation  Patient identified by MRN, date of birth, ID band Patient awake    Reviewed: Allergy & Precautions, Patient's Chart, lab work & pertinent test results  Airway Mallampati: II  TM Distance: >3 FB Neck ROM: Full    Dental no notable dental hx.    Pulmonary neg pulmonary ROS, former smoker   Pulmonary exam normal breath sounds clear to auscultation       Cardiovascular negative cardio ROS Normal cardiovascular exam Rhythm:Regular Rate:Normal     Neuro/Psych negative neurological ROS  negative psych ROS   GI/Hepatic negative GI ROS, Neg liver ROS,,,  Endo/Other  negative endocrine ROS    Renal/GU negative Renal ROS  negative genitourinary   Musculoskeletal negative musculoskeletal ROS (+)    Abdominal   Peds negative pediatric ROS (+)  Hematology negative hematology ROS (+) Hb 15, plt 265   Anesthesia Other Findings   Reproductive/Obstetrics (+) Pregnancy                             Anesthesia Physical Anesthesia Plan  ASA: 2  Anesthesia Plan: Epidural   Post-op Pain Management:    Induction:   PONV Risk Score and Plan: 2  Airway Management Planned: Natural Airway  Additional Equipment: None  Intra-op Plan:   Post-operative Plan:   Informed Consent: I have reviewed the patients History and Physical, chart, labs and discussed the procedure including the risks, benefits and alternatives for the proposed anesthesia with the patient or authorized representative who has indicated his/her understanding and acceptance.       Plan Discussed with:   Anesthesia Plan Comments:        Anesthesia Quick Evaluation

## 2022-08-18 NOTE — Anesthesia Procedure Notes (Signed)
Epidural Patient location during procedure: OB Start time: 08/18/2022 2:17 PM End time: 08/18/2022 2:26 PM  Staffing Anesthesiologist: Pervis Hocking, DO Performed: anesthesiologist   Preanesthetic Checklist Completed: patient identified, IV checked, risks and benefits discussed, monitors and equipment checked, pre-op evaluation and timeout performed  Epidural Patient position: sitting Prep: DuraPrep and site prepped and draped Patient monitoring: continuous pulse ox, blood pressure, heart rate and cardiac monitor Approach: midline Location: L3-L4 Injection technique: LOR air  Needle:  Needle type: Tuohy  Needle gauge: 17 G Needle length: 9 cm Needle insertion depth: 6 cm Catheter type: closed end flexible Catheter size: 19 Gauge Catheter at skin depth: 11 cm Test dose: negative  Assessment Sensory level: T8 Events: blood not aspirated, no cerebrospinal fluid, injection not painful, no injection resistance, no paresthesia and negative IV test  Additional Notes Patient identified. Risks/Benefits/Options discussed with patient including but not limited to bleeding, infection, nerve damage, paralysis, failed block, incomplete pain control, headache, blood pressure changes, nausea, vomiting, reactions to medication both or allergic, itching and postpartum back pain. Confirmed with bedside nurse the patient's most recent platelet count. Confirmed with patient that they are not currently taking any anticoagulation, have any bleeding history or any family history of bleeding disorders. Patient expressed understanding and wished to proceed. All questions were answered. Sterile technique was used throughout the entire procedure. Please see nursing notes for vital signs. Test dose was given through epidural catheter and negative prior to continuing to dose epidural or start infusion. Warning signs of high block given to the patient including shortness of breath, tingling/numbness in  hands, complete motor block, or any concerning symptoms with instructions to call for help. Patient was given instructions on fall risk and not to get out of bed. All questions and concerns addressed with instructions to call with any issues or inadequate analgesia.  Reason for block:procedure for pain

## 2022-08-19 ENCOUNTER — Encounter (HOSPITAL_COMMUNITY): Payer: Self-pay | Admitting: Obstetrics & Gynecology

## 2022-08-19 ENCOUNTER — Inpatient Hospital Stay (HOSPITAL_COMMUNITY)
Admission: RE | Admit: 2022-08-19 | Payer: Medicaid Other | Source: Home / Self Care | Admitting: Obstetrics and Gynecology

## 2022-08-19 ENCOUNTER — Inpatient Hospital Stay (HOSPITAL_COMMUNITY): Payer: Medicaid Other

## 2022-08-19 LAB — CBC
HCT: 34.5 % — ABNORMAL LOW (ref 36.0–46.0)
Hemoglobin: 12.1 g/dL (ref 12.0–15.0)
MCH: 29.4 pg (ref 26.0–34.0)
MCHC: 35.1 g/dL (ref 30.0–36.0)
MCV: 83.9 fL (ref 80.0–100.0)
Platelets: 194 10*3/uL (ref 150–400)
RBC: 4.11 MIL/uL (ref 3.87–5.11)
RDW: 15 % (ref 11.5–15.5)
WBC: 16.3 10*3/uL — ABNORMAL HIGH (ref 4.0–10.5)
nRBC: 0 % (ref 0.0–0.2)

## 2022-08-19 LAB — RPR: RPR Ser Ql: NONREACTIVE

## 2022-08-19 NOTE — Anesthesia Postprocedure Evaluation (Signed)
Anesthesia Post Note  Patient: Angela Mann  Procedure(s) Performed: AN AD HOC LABOR EPIDURAL     Patient location during evaluation: Mother Baby Anesthesia Type: Epidural Level of consciousness: awake and alert Pain management: pain level controlled Vital Signs Assessment: post-procedure vital signs reviewed and stable Respiratory status: spontaneous breathing, nonlabored ventilation and respiratory function stable Cardiovascular status: stable Postop Assessment: no headache, no backache and epidural receding Anesthetic complications: no  No notable events documented.  Last Vitals:  Vitals:   08/19/22 0035 08/19/22 0450  BP: 122/73 122/74  Pulse: 83 82  Resp: 16 16  Temp: 37 C 37.1 C  SpO2: 99% 100%    Last Pain:  Vitals:   08/19/22 0558  TempSrc:   PainSc: 0-No pain   Pain Goal:                   [name]

## 2022-08-19 NOTE — Progress Notes (Signed)
Postpartum Progress Note  Post Partum Day 1 s/p spontaneous vaginal delivery.  Patient reports well-controlled pain, ambulating without difficulty, voiding spontaneously, tolerating PO.  Vaginal bleeding is appropriate.   Objective: Blood pressure 122/74, pulse 82, temperature 98.7 F (37.1 C), temperature source Oral, resp. rate 16, height 5\' 5"  (1.651 m), weight 88 kg, SpO2 100 %, unknown if currently breastfeeding.  Physical Exam:  General: alert and no distress Lochia: appropriate Uterine Fundus: firm DVT Evaluation: No evidence of DVT seen on physical exam.  Recent Labs    08/18/22 1337 08/19/22 0516  HGB 15.0 12.1  HCT 42.1 34.5*    Assessment/Plan: Postpartum Day 1, s/p vaginal delivery. Continue routine postpartum care Baby boy - desires circ, will perform when cleared.  Lactation following Anticipate discharge home later today or tomorrow   LOS: 1 day   Carlyon Shadow 08/19/2022, 7:53 AM

## 2022-08-20 ENCOUNTER — Encounter (HOSPITAL_COMMUNITY): Payer: Self-pay | Admitting: Obstetrics & Gynecology

## 2022-08-20 LAB — SURGICAL PATHOLOGY

## 2022-08-20 MED ORDER — ACETAMINOPHEN 325 MG PO TABS: 650.0000 mg | ORAL_TABLET | Freq: Four times a day (QID) | ORAL | 0 refills | Status: AC | PRN

## 2022-08-20 MED ORDER — IBUPROFEN 600 MG PO TABS: 600.0000 mg | ORAL_TABLET | Freq: Four times a day (QID) | ORAL | 0 refills | Status: AC | PRN

## 2022-08-20 NOTE — Lactation Note (Signed)
This note was copied from a baby's chart. Lactation Consultation Note  Patient Name: Angela Mann Date: 08/20/2022 Reason for consult: Follow-up assessment;Term Age:37 hours   P3: Term infant at 39+6 weeks Feeding preference: Breast/formula Weight loss: 1%  Baby (no name yet) was swaddled and asleep in father's arms when I arrived.  Mother has been primarily formula feeding due to increased jaundice level.  The last level obtained was 9.0 mg/dl at 36 hours of life.  Baby has been consuming adequate volumes and has had multiple voids/stools.  Encouraged mother to continue latching prior to giving formula supplementation for breast stimulation.  She has an electric pump set up at bedside but has not been consistently pumping.  She also has her own personal pump at bedside.  Reviewed flange size.  Parents had no further questions/concerns at this time; anticipating discharge and ready to go home.   Maternal Data    Feeding Mother's Current Feeding Choice: Breast Milk and Formula  LATCH Score                    Lactation Tools Discussed/Used    Interventions    Discharge Discharge Education: Engorgement and breast care Pump: Personal  Consult Status Consult Status: Complete Date: 08/20/22 Follow-up type: Call as needed    Lanice Schwab Advith Martine 08/20/2022, 9:39 AM

## 2022-08-20 NOTE — Plan of Care (Signed)

## 2022-08-20 NOTE — Progress Notes (Signed)
Post Partum Day 2 Subjective: no complaints, up ad lib, voiding, tolerating PO, and + flatus  Objective: Blood pressure 127/82, pulse 79, temperature 97.9 F (36.6 C), temperature source Oral, resp. rate 20, height 5\' 5"  (1.651 m), weight 88 kg, SpO2 100 %, unknown if currently breastfeeding.  Physical Exam:  General: alert, cooperative, and no distress Lochia: appropriate Uterine Fundus: firm Incision: healing well DVT Evaluation: No evidence of DVT seen on physical exam.  Recent Labs    08/18/22 1337 08/19/22 0516  HGB 15.0 12.1  HCT 42.1 34.5*    Assessment/Plan: Discharge home D/W circumcision on newborn boy. Risks reviewed. All questions answered. She states she understands and agrees.  LOS: 2 days   Shon Millet II, MD 08/20/2022, 10:01 AM

## 2022-08-20 NOTE — Plan of Care (Signed)
  Problem: Education: Goal: Knowledge of condition will improve 08/20/2022 1014 by Rodolph Bong, LPN Outcome: Adequate for Discharge 08/20/2022 0730 by Rodolph Bong, LPN Outcome: Progressing Goal: Individualized Educational Video(s) 08/20/2022 1014 by Rodolph Bong, LPN Outcome: Adequate for Discharge 08/20/2022 0730 by Rodolph Bong, LPN Outcome: Progressing Goal: Individualized Newborn Educational Video(s) 08/20/2022 1014 by Rodolph Bong, LPN Outcome: Adequate for Discharge 08/20/2022 0730 by Rodolph Bong, LPN Outcome: Progressing   Problem: Activity: Goal: Will verbalize the importance of balancing activity with adequate rest periods 08/20/2022 1014 by Rodolph Bong, LPN Outcome: Adequate for Discharge 08/20/2022 0730 by Rodolph Bong, LPN Outcome: Progressing Goal: Ability to tolerate increased activity will improve 08/20/2022 1014 by Rodolph Bong, LPN Outcome: Adequate for Discharge 08/20/2022 0730 by Rodolph Bong, LPN Outcome: Progressing   Problem: Coping: Goal: Ability to identify and utilize available resources and services will improve 08/20/2022 1014 by Rodolph Bong, LPN Outcome: Adequate for Discharge 08/20/2022 0730 by Rodolph Bong, LPN Outcome: Progressing   Problem: Life Cycle: Goal: Chance of risk for complications during the postpartum period will decrease 08/20/2022 1014 by Rodolph Bong, LPN Outcome: Adequate for Discharge 08/20/2022 0730 by Rodolph Bong, LPN Outcome: Progressing   Problem: Role Relationship: Goal: Ability to demonstrate positive interaction with newborn will improve 08/20/2022 1014 by Rodolph Bong, LPN Outcome: Adequate for Discharge 08/20/2022 0730 by Rodolph Bong, LPN Outcome: Progressing   Problem: Skin Integrity: Goal: Demonstration of wound healing without infection will improve 08/20/2022 1014 by Rodolph Bong, LPN Outcome: Adequate for Discharge 08/20/2022 0730 by Rodolph Bong, LPN Outcome: Progressing

## 2022-08-20 NOTE — Lactation Note (Signed)
This note was copied from a baby's chart. Lactation Consultation Note  Patient Name: Angela Mann [id number] Date: 08/20/2022   Age:37 hours   LC Note:  Attempted to visit with mother, however, she was asleep at this time.   Maternal Data    Feeding    LATCH Score                    Lactation Tools Discussed/Used    Interventions    Discharge    Consult Status      Angela Mann 08/20/2022, 8:11 AM

## 2022-08-20 NOTE — Discharge Summary (Signed)
Postpartum Discharge Summary  Date of Service updated1/4/24     Patient Name: Angela Mann DOB: 05/05/1986 MRN: [id number]  Date of admission: 08/18/2022 Delivery date:08/18/2022  Delivering provider: Linda Hedges  Date of discharge: 08/20/2022  Admitting diagnosis: Normal labor and delivery [O80] Pregnancy [Z34.90] Intrauterine pregnancy: [redacted]w[redacted]d    Secondary diagnosis:  Principal Problem:   Pregnancy Active Problems:   Normal labor and delivery  Additional problems:     Discharge diagnosis: Term Pregnancy Delivered                                              Post partum procedures: Augmentation: N/A Complications:   Hospital course: Onset of Labor With Vaginal Delivery      37y.o. yo [id number] 37w6das admitted in Active Labor on 08/18/2022. Labor course was complicated by  Membrane Rupture Time/Date: 3:51 PM ,08/18/2022   Delivery Method:Vaginal, Spontaneous  Episiotomy: None  Lacerations:  None  Patient had a postpartum course complicated by .  She is ambulating, tolerating a regular diet, passing flatus, and urinating well. Patient is discharged home in stable condition on 08/20/22.  Newborn Data: Birth date:08/18/2022  Birth time:5:24 PM  Gender:Female  Living status:Living  Apgars:9 ,9  Weight:3590 g   Magnesium Sulfate received: No BMZ received: No Rhophylac:No MMR:No T-DaP:Given prenatally Flu: No Transfusion:No  Physical exam  Vitals:   08/19/22 0450 08/19/22 0837 08/19/22 2118 08/20/22 0512  BP: 122/74 126/72 127/84 127/82  Pulse: 82 84 75 79  Resp: _0 Temp: 98.7 F (37.1 C)  98.4 F (36.9 C) 97.9 F (36.6 C)  TempSrc: Oral   Oral  SpO2: 100% 100% 100% 100%  Weight:      Height:       General: alert, cooperative, and no distress Lochia: appropriate Uterine Fundus: firm Incision: Healing well with no significant drainage DVT Evaluation: No evidence of DVT seen on physical exam. Labs: Lab Results  Component Value Date   WBC  16.3 (H) 08/19/2022   HGB 12.1 08/19/2022   HCT 34.5 (L) 08/19/2022   MCV 83.9 08/19/2022   PLT 194 08/19/2022      Latest Ref Rng & Units 05/06/2021    6:11 PM  CMP  Glucose 70 - 99 mg/dL 105   BUN 6 - 20 mg/dL 8   Creatinine 0.44 - 1.00 mg/dL 1.02   Sodium 135 - 145 mmol/L 139   Potassium 3.5 - 5.1 mmol/L 3.7   Chloride 98 - 111 mmol/L 105   CO2 22 - 32 mmol/L 26   Calcium 8.9 - 10.3 mg/dL 9.1   Total Protein 6.5 - 8.1 g/dL 6.5   Total Bilirubin 0.3 - 1.2 mg/dL 0.5   Alkaline Phos 38 - 126 U/L 61   AST 15 - 41 U/L 16   ALT 0 - 44 U/L 10    Edinburgh Score:    08/18/2022    8:35 PM  Edinburgh Postnatal Depression Scale Screening Tool  I have been able to laugh and see the funny side of things. 0  I have looked forward with enjoyment to things. 0  I have blamed myself unnecessarily when things went wrong. 1  I have been anxious or worried for no good reason. 1  I have felt scared or panicky for no good reason. 0  Things  have been getting on top of me. 0  I have been so unhappy that I have had difficulty sleeping. 0  I have felt sad or miserable. 0  I have been so unhappy that I have been crying. 0  The thought of harming myself has occurred to me. 0  Edinburgh Postnatal Depression Scale Total 2      After visit meds:  Allergies as of 08/20/2022       Reactions   Vicodin [hydrocodone-acetaminophen]    Nausea and severe headache        Medication List     TAKE these medications    acetaminophen 325 MG tablet Commonly known as: Tylenol Take 2 tablets (650 mg total) by mouth every 6 (six) hours as needed (for pain scale < 4).   ibuprofen 600 MG tablet Commonly known as: ADVIL Take 1 tablet (600 mg total) by mouth every 6 (six) hours as needed.         Discharge home in stable condition Infant Feeding: Breast Infant Disposition:home with mother Discharge instruction: per After Visit Summary and Postpartum booklet. Activity: Advance as tolerated.  Pelvic rest for 6 weeks.  Diet: routine diet Anticipated Birth Control: Unsure Postpartum Appointment:6 weeks Additional Postpartum F/U:  Future Appointments:No future appointments. Follow up Visit:      08/20/2022 Allena Katz, MD

## 2022-08-25 ENCOUNTER — Telehealth (HOSPITAL_COMMUNITY): Payer: Self-pay

## 2022-08-25 NOTE — Telephone Encounter (Signed)
Patient reports feeling pretty good. Patient has questions regarding lochia color and cramping. RN reviewed lochia and cramping with patient. Patient declines any other questions/concerns about her health and healing.  Patient reports that baby is doing well. Eating, peeing/pooping, and gaining weight well. RN provided patient with outpatient LC information. Baby sleeps in a bassinet. RN reviewed ABC's of safe sleep with patient. Patient declines any questions or concerns about baby.  EPDS score is 0.  Sharyn Lull [facility] 08/25/2022,1415

## 2022-12-31 DIAGNOSIS — R7303 Prediabetes: Secondary | ICD-10-CM | POA: Diagnosis not present

## 2022-12-31 DIAGNOSIS — E669 Obesity, unspecified: Secondary | ICD-10-CM | POA: Diagnosis not present

## 2022-12-31 DIAGNOSIS — J302 Other seasonal allergic rhinitis: Secondary | ICD-10-CM | POA: Diagnosis not present

## 2022-12-31 DIAGNOSIS — E7800 Pure hypercholesterolemia, unspecified: Secondary | ICD-10-CM | POA: Diagnosis not present

## 2022-12-31 DIAGNOSIS — Z6832 Body mass index (BMI) 32.0-32.9, adult: Secondary | ICD-10-CM | POA: Diagnosis not present

## 2022-12-31 DIAGNOSIS — Z0000 Encounter for general adult medical examination without abnormal findings: Secondary | ICD-10-CM | POA: Diagnosis not present

## 2023-01-01 DIAGNOSIS — R7303 Prediabetes: Secondary | ICD-10-CM | POA: Diagnosis not present

## 2023-01-01 DIAGNOSIS — E7800 Pure hypercholesterolemia, unspecified: Secondary | ICD-10-CM | POA: Diagnosis not present

## 2023-01-01 DIAGNOSIS — Z0000 Encounter for general adult medical examination without abnormal findings: Secondary | ICD-10-CM | POA: Diagnosis not present

## 2023-05-19 DIAGNOSIS — Z01419 Encounter for gynecological examination (general) (routine) without abnormal findings: Secondary | ICD-10-CM | POA: Diagnosis not present

## 2023-05-22 ENCOUNTER — Ambulatory Visit: Admission: RE | Admit: 2023-05-22 | Payer: Medicaid Other | Source: Ambulatory Visit

## 2023-05-22 ENCOUNTER — Other Ambulatory Visit: Payer: Self-pay | Admitting: Internal Medicine

## 2023-05-22 DIAGNOSIS — Z1231 Encounter for screening mammogram for malignant neoplasm of breast: Secondary | ICD-10-CM
